# Patient Record
Sex: Female | Born: 1980 | Race: White | Hispanic: No | Marital: Single | Smoking: Current some day smoker
Health system: Southern US, Community
[De-identification: ages and names within clinical notes are randomized; demographics above are authoritative.]

## PROBLEM LIST (undated history)

## (undated) DIAGNOSIS — IMO0002 Reserved for concepts with insufficient information to code with codable children: Secondary | ICD-10-CM

## (undated) DIAGNOSIS — F32A Depression, unspecified: Secondary | ICD-10-CM

## (undated) DIAGNOSIS — J449 Chronic obstructive pulmonary disease, unspecified: Secondary | ICD-10-CM

## (undated) DIAGNOSIS — F419 Anxiety disorder, unspecified: Secondary | ICD-10-CM

## (undated) DIAGNOSIS — F329 Major depressive disorder, single episode, unspecified: Secondary | ICD-10-CM

## (undated) DIAGNOSIS — F319 Bipolar disorder, unspecified: Secondary | ICD-10-CM

---

## 2007-11-14 HISTORY — PX: ABDOMINAL HYSTERECTOMY: SHX81

## 2011-08-16 ENCOUNTER — Encounter (HOSPITAL_COMMUNITY): Payer: Self-pay | Admitting: Emergency Medicine

## 2011-08-16 ENCOUNTER — Emergency Department (HOSPITAL_COMMUNITY)
Admission: EM | Admit: 2011-08-16 | Discharge: 2011-08-16 | Disposition: A | Discharge status: Home / Self Care | Payer: Medicaid Other | Attending: Emergency Medicine | Admitting: Emergency Medicine

## 2011-08-16 DIAGNOSIS — F1910 Other psychoactive substance abuse, uncomplicated: Secondary | ICD-10-CM | POA: Insufficient documentation

## 2011-08-16 DIAGNOSIS — J4489 Other specified chronic obstructive pulmonary disease: Secondary | ICD-10-CM | POA: Insufficient documentation

## 2011-08-16 DIAGNOSIS — F319 Bipolar disorder, unspecified: Secondary | ICD-10-CM | POA: Insufficient documentation

## 2011-08-16 DIAGNOSIS — F1010 Alcohol abuse, uncomplicated: Secondary | ICD-10-CM | POA: Insufficient documentation

## 2011-08-16 DIAGNOSIS — F411 Generalized anxiety disorder: Secondary | ICD-10-CM | POA: Insufficient documentation

## 2011-08-16 DIAGNOSIS — IMO0002 Reserved for concepts with insufficient information to code with codable children: Secondary | ICD-10-CM | POA: Insufficient documentation

## 2011-08-16 DIAGNOSIS — J449 Chronic obstructive pulmonary disease, unspecified: Secondary | ICD-10-CM | POA: Insufficient documentation

## 2011-08-16 HISTORY — DX: Anxiety disorder, unspecified: F41.9

## 2011-08-16 HISTORY — DX: Depression, unspecified: F32.A

## 2011-08-16 HISTORY — DX: Chronic obstructive pulmonary disease, unspecified: J44.9

## 2011-08-16 HISTORY — DX: Bipolar disorder, unspecified: F31.9

## 2011-08-16 HISTORY — DX: Reserved for concepts with insufficient information to code with codable children: IMO0002

## 2011-08-16 HISTORY — DX: Major depressive disorder, single episode, unspecified: F32.9

## 2011-08-16 LAB — BASIC METABOLIC PANEL
CO2: 22 mEq/L (ref 19–32)
Chloride: 105 mEq/L (ref 96–112)
Creatinine, Ser: 0.67 mg/dL (ref 0.50–1.10)
Potassium: 3.4 mEq/L — ABNORMAL LOW (ref 3.5–5.1)

## 2011-08-16 LAB — ETHANOL: Alcohol, Ethyl (B): 105 mg/dL — ABNORMAL HIGH (ref 0–11)

## 2011-08-16 LAB — CBC
HCT: 44 % (ref 36.0–46.0)
MCV: 96.5 fL (ref 78.0–100.0)
Platelets: 246 10*3/uL (ref 150–400)
RBC: 4.56 MIL/uL (ref 3.87–5.11)
WBC: 7.9 10*3/uL (ref 4.0–10.5)

## 2011-08-16 LAB — RAPID URINE DRUG SCREEN, HOSP PERFORMED
Cocaine: NOT DETECTED
Opiates: NOT DETECTED
Tetrahydrocannabinol: POSITIVE — AB

## 2011-08-16 LAB — ACETAMINOPHEN LEVEL: Acetaminophen (Tylenol), Serum: <15 ug/mL (ref 10–30)

## 2011-08-16 LAB — SALICYLATE LEVEL: Salicylate Lvl: 1.3 mg/dL — ABNORMAL LOW (ref 2.8–20.0)

## 2011-08-16 NOTE — Discharge Instructions (Signed)
If you would like to get help for alcohol or drug use, you may use [facility].

## 2011-08-16 NOTE — ED Notes (Signed)
Spoke with Dr. Colon Branch regarding patient's condition. Notified her that patient stated multiple times, "I want to go to sleep and never wake up." Patient also stated that she has kids, but they will be okay. Notified Dr. Colon Branch that patient states she has had several alcoholic beverages, has taken one 10-325 norco, and has taken 4 mg of xanax. Dr. Colon Branch stated to go ahead and move patient to room 17, to initiate suicide precautions, and to place orders for cbc, bmp, salicylate level, acetaminophen level, urine drug screen, urine pregnancy.

## 2011-08-16 NOTE — ED Notes (Signed)
Pt presents after taking some medications and she states she has overdosed on them. States she took 4mg  xanax 10mg  vicoden 2 sparks (16oz 8% alcohol) Pt is AAx4. She states that she has been under a lot of stress and upset bc boyfriend thinks she abuses drugs. Does have suicidal ideations. Pt states "I just want to go to sleep and never wake up. Denies any HI.
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
## 2011-08-16 NOTE — ED Notes (Signed)
Patient given frozen tray and po fluids

## 2011-08-16 NOTE — ED Notes (Signed)
Patient moved to room 17, environment secured. Cords and items taken out of room 17 to initiate suicide precautions. Sitter sitting with patient at this time. Patient has been wanded, valuables locked up with Engineer, materials. Belonging placed in bags with patient labels and secured in cabinets.

## 2011-08-27 ENCOUNTER — Emergency Department (HOSPITAL_COMMUNITY)
Admission: EM | Admit: 2011-08-27 | Discharge: 2011-08-27 | Disposition: A | Discharge status: Home / Self Care | Payer: Medicaid Other | Attending: Emergency Medicine | Admitting: Emergency Medicine

## 2011-08-27 ENCOUNTER — Encounter (HOSPITAL_COMMUNITY): Payer: Self-pay | Admitting: *Deleted

## 2011-08-27 ENCOUNTER — Emergency Department (HOSPITAL_COMMUNITY): Payer: Medicaid Other

## 2011-08-27 DIAGNOSIS — X500XXA Overexertion from strenuous movement or load, initial encounter: Secondary | ICD-10-CM | POA: Insufficient documentation

## 2011-08-27 DIAGNOSIS — S93401A Sprain of unspecified ligament of right ankle, initial encounter: Secondary | ICD-10-CM

## 2011-08-27 DIAGNOSIS — IMO0002 Reserved for concepts with insufficient information to code with codable children: Secondary | ICD-10-CM | POA: Insufficient documentation

## 2011-08-27 DIAGNOSIS — F411 Generalized anxiety disorder: Secondary | ICD-10-CM | POA: Insufficient documentation

## 2011-08-27 DIAGNOSIS — J4489 Other specified chronic obstructive pulmonary disease: Secondary | ICD-10-CM | POA: Insufficient documentation

## 2011-08-27 DIAGNOSIS — J449 Chronic obstructive pulmonary disease, unspecified: Secondary | ICD-10-CM | POA: Insufficient documentation

## 2011-08-27 DIAGNOSIS — S93409A Sprain of unspecified ligament of unspecified ankle, initial encounter: Secondary | ICD-10-CM | POA: Insufficient documentation

## 2011-08-27 DIAGNOSIS — F319 Bipolar disorder, unspecified: Secondary | ICD-10-CM | POA: Insufficient documentation

## 2011-08-27 DIAGNOSIS — F17200 Nicotine dependence, unspecified, uncomplicated: Secondary | ICD-10-CM | POA: Insufficient documentation

## 2011-08-27 MED ORDER — OXYCODONE-ACETAMINOPHEN 5-325 MG PO TABS
1.0000 | ORAL_TABLET | Freq: Once | ORAL | Status: AC
  Administered 2011-08-27: 1 via ORAL

## 2011-08-27 MED ORDER — OXYCODONE-ACETAMINOPHEN 5-325 MG PO TABS
ORAL_TABLET | ORAL | Status: AC
  Administered 2011-08-27: 1 via ORAL
  Filled 2011-08-27: qty 1

## 2011-08-27 MED ORDER — OXYCODONE-ACETAMINOPHEN 5-325 MG PO TABS: ORAL_TABLET | ORAL | Status: DC

## 2011-08-27 NOTE — Discharge Instructions (Signed)
Take the pain med as directed.  Take ibuprofen 800 mg every 8 hrs with foodd.apply ice several times daily amd elevate as much as possible.  Wear the splint for 2-3 weeks.  Follow up with either of the orthopedists.

## 2011-08-27 NOTE — ED Notes (Signed)
Pt had right ankle roll on her while on a step last night, swelling noted to right ankle

## 2011-08-27 NOTE — ED Notes (Signed)
Pt states she was going down a step and her "ankle rolled" swelling to lateral side of right ankle.

## 2011-08-27 NOTE — ED Notes (Signed)
Pt verbalized understanding of d/c instructions and without further questions

## 2011-08-27 NOTE — ED Provider Notes (Signed)
History     CSN: [id number]  Arrival date & time 08/27/11  1317   First MD Initiated Contact with Patient 08/27/11 1401      Chief Complaint  Patient presents with  . Ankle Pain    (Consider location/radiation/quality/duration/timing/severity/associated sxs/prior treatment) HPI Comments: Walking and inverted R ankle.  Has taken hydrocodone with no relief.  No other injuries.  Patient is a 31 y.o. female presenting with ankle pain. The history is provided by the patient. No language interpreter was used.  Ankle Pain  The incident occurred 3 to 5 hours ago. The incident occurred at home. The injury mechanism was a fall. Pain location: R ankle. The pain is severe. Pertinent negatives include no numbness. She reports no foreign bodies present. The symptoms are aggravated by bearing weight and palpation.    Past Medical History  Diagnosis Date  . Anxiety   . Depression   . Bipolar 1 disorder   . DDD (degenerative disc disease)   . COPD (chronic obstructive pulmonary disease)     Pt states she thinks she has either COPD or it is lung cancer.    Past Surgical History  Procedure Date  . Abdominal hysterectomy 11-14-07    Family History  Problem Relation Age of Onset  . Hypertension Mother   . Hypertension Father     History  Substance Use Topics  . Smoking status: Current Everyday Smoker -- 2.0 packs/day  . Smokeless tobacco: Not on file  . Alcohol Use: Yes    OB History    Grav Para Term Preterm Abortions TAB SAB Ect Mult Living                  Review of Systems  Musculoskeletal:       Ankle injury  Neurological: Negative for weakness and numbness.  All other systems reviewed and are negative.    Allergies  Ultram  Home Medications   Current Outpatient Rx  Name Route Sig Dispense Refill  . CEPHALEXIN 500 MG PO CAPS Oral Take 500 mg by mouth 3 (three) times daily.    Marland Kitchen FLUOXETINE HCL 20 MG PO TABS Oral Take 20 mg by mouth 4 (four) times daily.    Marland Kitchen  HYDROCODONE-ACETAMINOPHEN 10-325 MG PO TABS Oral Take 1 tablet by mouth 4 (four) times daily.     . OXYCODONE-ACETAMINOPHEN 5-325 MG PO TABS  One tab po q 6 hrs prn pain 12 tablet 0    BP 100/80  Pulse 74  Temp 97.5 F (36.4 C) (Oral)  Resp 16  Ht 5\' 7"  (1.702 m)  Wt 150 lb (68.04 kg)  BMI 23.49 kg/m2  SpO2 100%  Physical Exam  Nursing note and vitals reviewed. Constitutional: She is oriented to person, place, and time. She appears well-developed and well-nourished. No distress.  HENT:  Head: Normocephalic and atraumatic.  Eyes: EOM are normal.  Neck: Normal range of motion.  Cardiovascular: Normal rate, regular rhythm and normal heart sounds.   Pulmonary/Chest: Effort normal and breath sounds normal.  Abdominal: Soft. She exhibits no distension. There is no tenderness.  Musculoskeletal:       Right ankle: She exhibits decreased range of motion and swelling. She exhibits no ecchymosis, no deformity, no laceration and normal pulse. tenderness. Lateral malleolus tenderness found.       Feet:  Neurological: She is alert and oriented to person, place, and time.  Skin: Skin is warm and dry.  Psychiatric: She has a normal mood and affect. Judgment  normal.    ED Course  Procedures (including critical care time)  Labs Reviewed - No data to display Dg Ankle Complete Right  08/27/2011  *RADIOLOGY REPORT*  Clinical Data: 31 year old female with right ankle pain following injury.  RIGHT ANKLE - COMPLETE 3+ VIEW  Comparison: None  Findings: No evidence of acute fracture, subluxation or dislocation identified.  No radio-opaque foreign bodies are present.  No focal bony lesions are noted.  The joint spaces are unremarkable.  Lateral soft tissue swelling is identified.  IMPRESSION: Soft tissue swelling without acute bony abnormality.  Original Report Authenticated By: Rosendo Gros, M.D.     1. Right ankle sprain       MDM  ASO, crutches, ice, elevation.  rx-percocet, 12 F/u with dr.  Hilda Lias or Romeo Apple.       Worthy Rancher, PA 08/27/11 1545  Worthy Rancher, PA 08/27/11 1556

## 2011-08-27 NOTE — ED Provider Notes (Signed)
Medical screening examination/treatment/procedure(s) were performed by non-physician practitioner and as supervising physician I was immediately available for consultation/collaboration.  Shelda Jakes, MD 08/27/11 [phone]

## 2011-08-27 NOTE — ED Notes (Signed)
R. Miller, PA at bedside  

## 2011-10-19 ENCOUNTER — Emergency Department (HOSPITAL_COMMUNITY)
Admission: EM | Admit: 2011-10-19 | Discharge: 2011-10-19 | Disposition: A | Discharge status: Home / Self Care | Payer: Medicaid Other | Attending: Emergency Medicine | Admitting: Emergency Medicine

## 2011-10-19 ENCOUNTER — Encounter (HOSPITAL_COMMUNITY): Payer: Self-pay | Admitting: *Deleted

## 2011-10-19 DIAGNOSIS — F17200 Nicotine dependence, unspecified, uncomplicated: Secondary | ICD-10-CM | POA: Insufficient documentation

## 2011-10-19 DIAGNOSIS — J4489 Other specified chronic obstructive pulmonary disease: Secondary | ICD-10-CM | POA: Insufficient documentation

## 2011-10-19 DIAGNOSIS — F32A Depression, unspecified: Secondary | ICD-10-CM

## 2011-10-19 DIAGNOSIS — F329 Major depressive disorder, single episode, unspecified: Secondary | ICD-10-CM

## 2011-10-19 DIAGNOSIS — J449 Chronic obstructive pulmonary disease, unspecified: Secondary | ICD-10-CM | POA: Insufficient documentation

## 2011-10-19 DIAGNOSIS — F319 Bipolar disorder, unspecified: Secondary | ICD-10-CM | POA: Insufficient documentation

## 2011-10-19 DIAGNOSIS — Z046 Encounter for general psychiatric examination, requested by authority: Secondary | ICD-10-CM | POA: Insufficient documentation

## 2011-10-19 DIAGNOSIS — F411 Generalized anxiety disorder: Secondary | ICD-10-CM | POA: Insufficient documentation

## 2011-10-19 LAB — URINE MICROSCOPIC-ADD ON

## 2011-10-19 LAB — CBC
HCT: 43.7 % (ref 36.0–46.0)
Hemoglobin: 15.1 g/dL — ABNORMAL HIGH (ref 12.0–15.0)
MCH: 33.3 pg (ref 26.0–34.0)
MCV: 96.5 fL (ref 78.0–100.0)
RBC: 4.53 MIL/uL (ref 3.87–5.11)
WBC: 6.1 10*3/uL (ref 4.0–10.5)

## 2011-10-19 LAB — BASIC METABOLIC PANEL
BUN: 11 mg/dL (ref 6–23)
CO2: 23 mEq/L (ref 19–32)
Calcium: 9.2 mg/dL (ref 8.4–10.5)
Chloride: 108 mEq/L (ref 96–112)
Creatinine, Ser: 0.66 mg/dL (ref 0.50–1.10)
Glucose, Bld: 106 mg/dL — ABNORMAL HIGH (ref 70–99)

## 2011-10-19 LAB — URINALYSIS, ROUTINE W REFLEX MICROSCOPIC
Bilirubin Urine: NEGATIVE
Glucose, UA: NEGATIVE mg/dL
Ketones, ur: NEGATIVE mg/dL
Specific Gravity, Urine: 1.015 (ref 1.005–1.030)
pH: 6 (ref 5.0–8.0)

## 2011-10-19 LAB — RAPID URINE DRUG SCREEN, HOSP PERFORMED
Barbiturates: NOT DETECTED
Benzodiazepines: NOT DETECTED
Cocaine: NOT DETECTED
Opiates: NOT DETECTED
Tetrahydrocannabinol: POSITIVE — AB

## 2011-10-19 NOTE — ED Notes (Addendum)
Pt to department by SD. Reports general suicidal ideation, no specific plan at this time.  Pt tearful but cooperative at present. Pt reporting that she doesn't take her Xanax or Prozac as it affects her ability to carry out daily activities. Reports no mental health services.

## 2011-10-19 NOTE — ED Provider Notes (Signed)
Pt no longer suicidal. She wants to go home and follow up as out pt.  Pt had alcohol in her last night when she was depressed.  Benny Lennert, MD 10/19/11 [phone]
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
## 2011-10-19 NOTE — ED Notes (Signed)
Pt remains sleeping. NAD. Pt has not woken up to eat breakfast. Sitter remains at bedside.
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
## 2011-10-19 NOTE — ED Notes (Signed)
Pt's family member arrived to take pt home. Pt signed out and escorted out to family.

## 2011-10-19 NOTE — ED Notes (Signed)
Pt sitting in the floor stating she wants to talk to someone soon because she is worried about her boy. Informed pt that [facility] is currently in the ED and will be in shortly. Asked pt to get into bed and out of floor. Pt refused. Offered pt a chair and pt refused.

## 2013-11-13 ENCOUNTER — Emergency Department (HOSPITAL_COMMUNITY)
Admission: EM | Admit: 2013-11-13 | Discharge: 2013-11-13 | Discharge status: Against Medical Advice | Payer: Medicaid Other | Attending: Emergency Medicine | Admitting: Emergency Medicine

## 2013-11-13 ENCOUNTER — Encounter (HOSPITAL_COMMUNITY): Payer: Self-pay | Admitting: Emergency Medicine

## 2013-11-13 DIAGNOSIS — J449 Chronic obstructive pulmonary disease, unspecified: Secondary | ICD-10-CM | POA: Insufficient documentation

## 2013-11-13 DIAGNOSIS — J4489 Other specified chronic obstructive pulmonary disease: Secondary | ICD-10-CM | POA: Insufficient documentation

## 2013-11-13 DIAGNOSIS — F17200 Nicotine dependence, unspecified, uncomplicated: Secondary | ICD-10-CM | POA: Diagnosis not present

## 2013-11-13 MED ORDER — ALBUTEROL SULFATE (2.5 MG/3ML) 0.083% IN NEBU
5.0000 mg | INHALATION_SOLUTION | Freq: Once | RESPIRATORY_TRACT | Status: AC
  Administered 2013-11-13: 5 mg via RESPIRATORY_TRACT
  Filled 2013-11-13: qty 6

## 2013-11-13 NOTE — ED Notes (Signed)
Patient states she is going to leave.  Told her to come back if needed

## 2013-11-13 NOTE — ED Notes (Signed)
Pt. seen at [facility] yesterday diagnosed with bronchitis  reports persistent dry cough with chest congestion and SOB , denies fever or chills.

## 2015-11-26 ENCOUNTER — Emergency Department (HOSPITAL_COMMUNITY): Payer: Medicaid Other

## 2015-11-26 ENCOUNTER — Encounter (HOSPITAL_COMMUNITY): Payer: Self-pay | Admitting: *Deleted

## 2015-11-26 ENCOUNTER — Emergency Department (HOSPITAL_COMMUNITY)
Admission: EM | Admit: 2015-11-26 | Discharge: 2015-11-27 | Disposition: A | Discharge status: Home / Self Care | Payer: Medicaid Other | Attending: Emergency Medicine | Admitting: Emergency Medicine

## 2015-11-26 DIAGNOSIS — F17200 Nicotine dependence, unspecified, uncomplicated: Secondary | ICD-10-CM | POA: Insufficient documentation

## 2015-11-26 DIAGNOSIS — J209 Acute bronchitis, unspecified: Secondary | ICD-10-CM | POA: Diagnosis not present

## 2015-11-26 DIAGNOSIS — Z79899 Other long term (current) drug therapy: Secondary | ICD-10-CM | POA: Insufficient documentation

## 2015-11-26 DIAGNOSIS — R05 Cough: Secondary | ICD-10-CM | POA: Diagnosis present

## 2015-11-26 DIAGNOSIS — J449 Chronic obstructive pulmonary disease, unspecified: Secondary | ICD-10-CM | POA: Insufficient documentation

## 2015-11-26 LAB — BASIC METABOLIC PANEL
ANION GAP: 8 (ref 5–15)
BUN: 12 mg/dL (ref 6–20)
CHLORIDE: 103 mmol/L (ref 101–111)
CO2: 20 mmol/L — AB (ref 22–32)
Calcium: 8.7 mg/dL — ABNORMAL LOW (ref 8.9–10.3)
Creatinine, Ser: 1 mg/dL (ref 0.44–1.00)
GFR calc Af Amer: >60 mL/min (ref >60)
GLUCOSE: 115 mg/dL — AB (ref 65–99)
POTASSIUM: 3.2 mmol/L — AB (ref 3.5–5.1)
Sodium: 131 mmol/L — ABNORMAL LOW (ref 135–145)

## 2015-11-26 LAB — CBC WITH DIFFERENTIAL/PLATELET
BASOS ABS: 0 10*3/uL (ref 0.0–0.1)
Basophils Relative: 0 %
EOS PCT: 2 %
Eosinophils Absolute: 0.3 10*3/uL (ref 0.0–0.7)
HEMATOCRIT: 47.2 % — AB (ref 36.0–46.0)
HEMOGLOBIN: 16.9 g/dL — AB (ref 12.0–15.0)
LYMPHS ABS: 4.6 10*3/uL — AB (ref 0.7–4.0)
LYMPHS PCT: 32 %
MCH: 34.4 pg — AB (ref 26.0–34.0)
MCHC: 35.8 g/dL (ref 30.0–36.0)
MCV: 96.1 fL (ref 78.0–100.0)
Monocytes Absolute: 1.1 10*3/uL — ABNORMAL HIGH (ref 0.1–1.0)
Monocytes Relative: 8 %
NEUTROS ABS: 8.1 10*3/uL — AB (ref 1.7–7.7)
NEUTROS PCT: 58 %
PLATELETS: 326 10*3/uL (ref 150–400)
RBC: 4.91 MIL/uL (ref 3.87–5.11)
RDW: 12.4 % (ref 11.5–15.5)
WBC: 14.1 10*3/uL — ABNORMAL HIGH (ref 4.0–10.5)

## 2015-11-26 LAB — TROPONIN I: Troponin I: <0.03 ng/mL (ref <0.03)

## 2015-11-26 MED ORDER — SODIUM CHLORIDE 0.9 % IV BOLUS (SEPSIS)
1000.0000 mL | Freq: Once | INTRAVENOUS | Status: AC
  Administered 2015-11-26: 1000 mL via INTRAVENOUS

## 2015-11-26 MED ORDER — IOPAMIDOL (ISOVUE-370) INJECTION 76%
100.0000 mL | Freq: Once | INTRAVENOUS | Status: AC | PRN
  Administered 2015-11-27: 100 mL via INTRAVENOUS

## 2015-11-26 MED ORDER — MORPHINE SULFATE (PF) 4 MG/ML IV SOLN
4.0000 mg | Freq: Once | INTRAVENOUS | Status: AC
Start: 2015-11-26 — End: 2015-11-26
  Administered 2015-11-26: 4 mg via INTRAVENOUS
  Filled 2015-11-26: qty 1

## 2015-11-26 MED ORDER — ONDANSETRON HCL 4 MG/2ML IJ SOLN
4.0000 mg | Freq: Once | INTRAMUSCULAR | Status: AC
  Administered 2015-11-26: 4 mg via INTRAVENOUS
  Filled 2015-11-26: qty 2

## 2015-11-26 NOTE — ED Triage Notes (Signed)
Pt states that she had a pcp appointment on 11/16/2015 for copd excerebration, sinus infection, was placed on antibiotics and steroids, did not get any better, was seen at pcp office yesterday, had chest xray performed with negative results, was given second dose of steroids but pt did not pick up prescription, presents to er today with c/o dehydration, cough that is productive with clear/ green sputum production, chest pain that is worse with coughing,

## 2015-11-27 MED ORDER — KETOROLAC TROMETHAMINE 30 MG/ML IJ SOLN
30.0000 mg | Freq: Once | INTRAMUSCULAR | Status: AC
  Administered 2015-11-27: 30 mg via INTRAVENOUS
  Filled 2015-11-27: qty 1

## 2015-11-27 MED ORDER — NAPROXEN 500 MG PO TABS: 500.0000 mg | ORAL_TABLET | Freq: Two times a day (BID) | ORAL | 0 refills | Status: DC

## 2015-11-27 MED ORDER — PROMETHAZINE-CODEINE 6.25-10 MG/5ML PO SYRP: 5.0000 mL | ORAL_SOLUTION | ORAL | 0 refills | Status: DC | PRN

## 2015-11-27 MED ORDER — BENZONATATE 100 MG PO CAPS: 200.0000 mg | ORAL_CAPSULE | Freq: Three times a day (TID) | ORAL | 0 refills | Status: DC | PRN

## 2015-11-27 MED ORDER — PROMETHAZINE-CODEINE 6.25-10 MG/5ML PO SYRP
5.0000 mL | ORAL_SOLUTION | Freq: Once | ORAL | Status: AC
  Administered 2015-11-27: 5 mL via ORAL
  Filled 2015-11-27: qty 5

## 2015-11-27 NOTE — ED Provider Notes (Signed)
[facility] Provider Note   CSN: [id number] Arrival date & time: 11/26/15  2152     History   Chief Complaint Chief Complaint  Patient presents with  . Cough    HPI Emma Conner is a 35 y.o. female with a past medical history significant for COPD presenting with a 10 day history of COPD exacerbation.  She initially had a sinus infection and was placed on antibiotics and steroids, completing a Z-Pak 5 days ago which did not improve her symptoms, in fact feels worse.  She describes cough which has been occasionally productive of blood-tinged sputum associated with left anterior and posterior chest wall pain with both coughing and deep inspiration.  She endorses shortness of breath and intermittent wheezing.  She denies any ongoing fevers or chills.  She was seen by her PCP yesterday at which point a repeat chest x-ray was performed and she reports this was negative.  She was placed on a second round of steroids yesterday but has not yet filled this prescription.  She denies abdominal pain, nausea or vomiting.  She also denies peripheral edema, calf or leg tenderness.  She has had no recent long periods of inactivty, no long road trips, she does not take estrogen therapy or other risk factors for dvt.  She is found alleviators for symptoms.  The history is provided by the patient and the spouse.    Past Medical History:  Diagnosis Date  . Anxiety   . Bipolar 1 disorder (HCC)   . COPD (chronic obstructive pulmonary disease) (HCC)    Pt states she thinks she has either COPD or it is lung cancer.  . DDD (degenerative disc disease)   . Depression     There are no active problems to display for this patient.   Past Surgical History:  Procedure Laterality Date  . ABDOMINAL HYSTERECTOMY  11-14-07    OB History    No data available       Home Medications    Prior to Admission medications   Medication Sig Start Date End Date Taking? Authorizing Provider  albuterol  (PROVENTIL HFA;VENTOLIN HFA) 108 (90 Base) MCG/ACT inhaler Inhale 1-2 puffs into the lungs every 6 (six) hours as needed for wheezing or shortness of breath.  11/16/15  Yes Historical Provider, MD  benzonatate (TESSALON) 100 MG capsule Take 2 capsules (200 mg total) by mouth 3 (three) times daily as needed. 11/27/15   Burgess Amor, PA-C  naproxen (NAPROSYN) 500 MG tablet Take 1 tablet (500 mg total) by mouth 2 (two) times daily. 11/27/15   Burgess Amor, PA-C  ondansetron (ZOFRAN-ODT) 8 MG disintegrating tablet Take 8 mg by mouth every 8 (eight) hours as needed for nausea or vomiting.  11/25/15   Historical Provider, MD  predniSONE (DELTASONE) 20 MG tablet Take 3 tabs on first day, then 2 tabs daily for 2 days, then 1 tab daily for 2 days, then 1/2 tab daily for 2 days starting on 11/25/2015 11/25/15 12/02/15  Historical Provider, MD    Family History Family History  Problem Relation Age of Onset  . Hypertension Mother   . Hypertension Father     Social History Social History  Substance Use Topics  . Smoking status: Current Some Day Smoker    Packs/day: 0.50  . Smokeless tobacco: Never Used  . Alcohol use Yes     Allergies   Benadryl [diphenhydramine]; Penicillins; and Ultram [tramadol]   Review of Systems Review of Systems  Constitutional: Negative for fever.  HENT: Positive for congestion. Negative for sore throat.   Eyes: Negative.   Respiratory: Positive for shortness of breath and wheezing. Negative for chest tightness.   Cardiovascular: Positive for chest pain. Negative for palpitations and leg swelling.  Gastrointestinal: Negative for abdominal pain, nausea and vomiting.  Genitourinary: Negative.   Musculoskeletal: Negative for arthralgias, joint swelling and neck pain.  Skin: Negative.  Negative for rash and wound.  Neurological: Positive for weakness. Negative for dizziness, light-headedness, numbness and headaches.  Psychiatric/Behavioral: Negative.      Physical  Exam Updated Vital Signs BP 103/75   Pulse 90   Temp 97.8 F (36.6 C) (Oral)   Resp 20   Ht 5\' 7"  (1.702 m)   Wt 88.9 kg   SpO2 99%   BMI 30.70 kg/m   Physical Exam  Constitutional: She appears well-developed and well-nourished.  HENT:  Head: Normocephalic and atraumatic.  Eyes: Conjunctivae are normal.  Neck: Normal range of motion.  Cardiovascular: Normal rate, regular rhythm, normal heart sounds and intact distal pulses.   Initially tachycardic.   Pulmonary/Chest: Effort normal. No accessory muscle usage. No respiratory distress. She has wheezes in the left lower field.  Sparse left lower field expiratory wheeze, otherwise patient has good aeration throughout all lung fields, no areas of consolidation, no crackles or rales.  She is tender to palpation along mid left chest wall anterior, mid axillary line and mid left upper back.  Abdominal: Soft. Bowel sounds are normal. There is no tenderness.  Musculoskeletal: Normal range of motion. She exhibits no edema.  Neurological: She is alert.  Skin: Skin is warm and dry.  Psychiatric: She has a normal mood and affect.  Nursing note and vitals reviewed.    ED Treatments / Results  Labs (all labs ordered are listed, but only abnormal results are displayed) Labs Reviewed  CBC WITH DIFFERENTIAL/PLATELET - Abnormal; Notable for the following:       Result Value   WBC 14.1 (*)    Hemoglobin 16.9 (*)    HCT 47.2 (*)    MCH 34.4 (*)    Neutro Abs 8.1 (*)    Lymphs Abs 4.6 (*)    Monocytes Absolute 1.1 (*)    All other components within normal limits  BASIC METABOLIC PANEL - Abnormal; Notable for the following:    Sodium 131 (*)    Potassium 3.2 (*)    CO2 20 (*)    Glucose, Bld 115 (*)    Calcium 8.7 (*)    All other components within normal limits  TROPONIN I    EKG  EKG Interpretation  Date/Time:  Thursday November 26 2015 22:11:59 EDT Ventricular Rate:  107 PR Interval:  136 QRS Duration: 72 QT  Interval:  314 QTC Calculation: 419 R Axis:   90 Text Interpretation:  Sinus tachycardia Rightward axis No old tracing to compare Confirmed by KNAPP  MD-I, IVA ([id number]) on 11/27/2015 1:03:52 AM       Radiology  Results for orders placed or performed during the hospital encounter of 11/26/15  CBC with Differential  Result Value Ref Range   WBC 14.1 (H) 4.0 - 10.5 K/uL   RBC 4.91 3.87 - 5.11 MIL/uL   Hemoglobin 16.9 (H) 12.0 - 15.0 g/dL   HCT 95.647.2 (H) 21.336.0 - 08.646.0 %   MCV 96.1 78.0 - 100.0 fL   MCH 34.4 (H) 26.0 - 34.0 pg   MCHC 35.8 30.0 - 36.0 g/dL   RDW 57.812.4 46.911.5 - 62.915.5 %  Platelets 326 150 - 400 K/uL   Neutrophils Relative % 58 %   Neutro Abs 8.1 (H) 1.7 - 7.7 K/uL   Lymphocytes Relative 32 %   Lymphs Abs 4.6 (H) 0.7 - 4.0 K/uL   Monocytes Relative 8 %   Monocytes Absolute 1.1 (H) 0.1 - 1.0 K/uL   Eosinophils Relative 2 %   Eosinophils Absolute 0.3 0.0 - 0.7 K/uL   Basophils Relative 0 %   Basophils Absolute 0.0 0.0 - 0.1 K/uL  Basic metabolic panel  Result Value Ref Range   Sodium 131 (L) 135 - 145 mmol/L   Potassium 3.2 (L) 3.5 - 5.1 mmol/L   Chloride 103 101 - 111 mmol/L   CO2 20 (L) 22 - 32 mmol/L   Glucose, Bld 115 (H) 65 - 99 mg/dL   BUN 12 6 - 20 mg/dL   Creatinine, Ser 1.61 0.44 - 1.00 mg/dL   Calcium 8.7 (L) 8.9 - 10.3 mg/dL   GFR calc non Af Amer >60 >60 mL/min   GFR calc Af Amer >60 >60 mL/min   Anion gap 8 5 - 15  Troponin I  Result Value Ref Range   Troponin I <0.03 <0.03 ng/mL   Dg Chest 2 View  Result Date: 11/26/2015 CLINICAL DATA:  35 y/o F; for worsening productive cough, weakness, and shortness of breath. History of COPD. EXAM: CHEST  2 VIEW COMPARISON:  None. FINDINGS: Normal cardiomediastinal silhouette. Hyperinflated lungs consistent with history of COPD. No focal consolidation, pneumothorax, or pleural effusion. No acute osseous abnormality. Mild multilevel degenerative changes of the spine. IMPRESSION: No active cardiopulmonary disease.   COPD. Electronically Signed   By: Mitzi Hansen M.D.   On: 11/26/2015 22:52   Ct Angio Chest Pe W Or Wo Contrast  Result Date: 11/27/2015 CLINICAL DATA:  Acute onset of pleuritic chest pain. Initial encounter. EXAM: CT ANGIOGRAPHY CHEST WITH CONTRAST TECHNIQUE: Multidetector CT imaging of the chest was performed using the standard protocol during bolus administration of intravenous contrast. Multiplanar CT image reconstructions and MIPs were obtained to evaluate the vascular anatomy. CONTRAST:  10 mL of Isovue 370 IV contrast COMPARISON:  Chest radiograph performed earlier today at 10:12 p.m. FINDINGS: Cardiovascular: There is no evidence of pulmonary embolus. The heart is unremarkable in appearance. The thoracic aorta appears intact. The great vessels are unremarkable. Mediastinum/Nodes: The mediastinum is unremarkable in appearance. No mediastinal lymphadenopathy is seen. No pericardial effusions identified. The thyroid gland is unremarkable. No axillary lymphadenopathy is appreciated. Lungs/Pleura: The lungs appear essentially clear bilaterally. No focal consolidation, pleural effusion or pneumothorax is seen. No masses are identified. Upper Abdomen: The visualized portions of the liver and spleen are unremarkable. The visualized portions of the gallbladder, pancreas, adrenal glands and kidneys are within normal limits. Musculoskeletal: No acute osseous abnormalities are identified. The visualized musculature is unremarkable in appearance. Review of the MIP images confirms the above findings. IMPRESSION: 1. No evidence of pulmonary embolus. 2. Lungs clear bilaterally. Electronically Signed   By: Roanna Raider M.D.   On: 11/27/2015 06:44     Procedures Procedures (including critical care time)  Medications Ordered in ED Medications  sodium chloride 0.9 % bolus 1,000 mL (0 mLs Intravenous Stopped 11/27/15 0046)  morphine 4 MG/ML injection 4 mg (4 mg Intravenous Given 11/26/15 2355)   ondansetron (ZOFRAN) injection 4 mg (4 mg Intravenous Given 11/26/15 2355)  iopamidol (ISOVUE-370) 76 % injection 100 mL (100 mLs Intravenous Contrast Given 11/27/15 0021)     Initial Impression /  Assessment and Plan / ED Course  I have reviewed the triage vital signs and the nursing notes.  Pertinent labs & imaging results that were available during my care of the patient were reviewed by me and considered in my medical decision making (see chart for details).  Clinical Course    Pt with sx suggesting acute bronchitis, but pleuritic quality and hemoptysis, CT angio negative for PE or pneumonia.  Pt placed on tessalon, naproxen.  Encouraged to get her prednisone filled she was prescribed by her pcp.  Plan f/u with pcp if sx persist or worsen.  Final Clinical Impressions(s) / ED Diagnoses   Final diagnoses:  Acute bronchitis, unspecified organism    New Prescriptions New Prescriptions   BENZONATATE (TESSALON) 100 MG CAPSULE    Take 2 capsules (200 mg total) by mouth 3 (three) times daily as needed.   NAPROXEN (NAPROSYN) 500 MG TABLET    Take 1 tablet (500 mg total) by mouth 2 (two) times daily.     Burgess Amor, PA-C 11/27/15 0123    Burgess Amor, PA-C 11/27/15 1610    Devoria Albe, MD 11/30/15 2259

## 2015-11-27 NOTE — Discharge Instructions (Signed)
Continue to take the prednisone you were prescribed by your doctor.  Start the medicines prescribed for inflammatory pain and to help with cough suppression. Rest and make sure you are drinking plenty of fluids.

## 2015-11-27 NOTE — ED Notes (Signed)
Pt requesting ice chips, warm blanket, explained to pt that she could not eat or drink due to pending test results, warm blanket given per request, update given,

## 2015-11-27 NOTE — ED Notes (Signed)
Pt requesting pain medication, Raynelle FanningJulie PA notified,

## 2016-06-28 ENCOUNTER — Emergency Department (HOSPITAL_COMMUNITY)
Admission: EM | Admit: 2016-06-28 | Discharge: 2016-07-01 | Disposition: A | Discharge status: Home / Self Care | Payer: Medicaid Other | Attending: Emergency Medicine | Admitting: Emergency Medicine

## 2016-06-28 ENCOUNTER — Encounter (HOSPITAL_COMMUNITY): Payer: Self-pay | Admitting: Emergency Medicine

## 2016-06-28 DIAGNOSIS — F419 Anxiety disorder, unspecified: Secondary | ICD-10-CM | POA: Diagnosis not present

## 2016-06-28 DIAGNOSIS — F329 Major depressive disorder, single episode, unspecified: Secondary | ICD-10-CM

## 2016-06-28 DIAGNOSIS — F17210 Nicotine dependence, cigarettes, uncomplicated: Secondary | ICD-10-CM | POA: Insufficient documentation

## 2016-06-28 DIAGNOSIS — F319 Bipolar disorder, unspecified: Secondary | ICD-10-CM | POA: Diagnosis present

## 2016-06-28 DIAGNOSIS — J449 Chronic obstructive pulmonary disease, unspecified: Secondary | ICD-10-CM | POA: Insufficient documentation

## 2016-06-28 DIAGNOSIS — F1290 Cannabis use, unspecified, uncomplicated: Secondary | ICD-10-CM | POA: Diagnosis not present

## 2016-06-28 DIAGNOSIS — F3161 Bipolar disorder, current episode mixed, mild: Secondary | ICD-10-CM | POA: Diagnosis not present

## 2016-06-28 DIAGNOSIS — Z79899 Other long term (current) drug therapy: Secondary | ICD-10-CM | POA: Diagnosis not present

## 2016-06-28 DIAGNOSIS — F32A Depression, unspecified: Secondary | ICD-10-CM

## 2016-06-28 LAB — I-STAT BETA HCG BLOOD, ED (MC, WL, AP ONLY): I-stat hCG, quantitative: <5 m[IU]/mL (ref <5)

## 2016-06-28 LAB — CBC WITH DIFFERENTIAL/PLATELET
Basophils Absolute: 0 10*3/uL (ref 0.0–0.1)
Basophils Relative: 0 %
EOS PCT: 6 %
Eosinophils Absolute: 0.7 10*3/uL (ref 0.0–0.7)
HEMATOCRIT: 42.7 % (ref 36.0–46.0)
Hemoglobin: 14.7 g/dL (ref 12.0–15.0)
LYMPHS ABS: 2.3 10*3/uL (ref 0.7–4.0)
LYMPHS PCT: 20 %
MCH: 34.1 pg — AB (ref 26.0–34.0)
MCHC: 34.4 g/dL (ref 30.0–36.0)
MCV: 99.1 fL (ref 78.0–100.0)
MONO ABS: 0.8 10*3/uL (ref 0.1–1.0)
Monocytes Relative: 7 %
NEUTROS ABS: 7.7 10*3/uL (ref 1.7–7.7)
Neutrophils Relative %: 67 %
PLATELETS: 299 10*3/uL (ref 150–400)
RBC: 4.31 MIL/uL (ref 3.87–5.11)
RDW: 13.5 % (ref 11.5–15.5)
WBC: 11.6 10*3/uL — ABNORMAL HIGH (ref 4.0–10.5)

## 2016-06-28 LAB — ACETAMINOPHEN LEVEL: Acetaminophen (Tylenol), Serum: 11 ug/mL (ref 10–30)

## 2016-06-28 LAB — RAPID URINE DRUG SCREEN, HOSP PERFORMED
Amphetamines: NOT DETECTED
Barbiturates: NOT DETECTED
Benzodiazepines: NOT DETECTED
COCAINE: NOT DETECTED
OPIATES: NOT DETECTED
TETRAHYDROCANNABINOL: POSITIVE — AB

## 2016-06-28 LAB — COMPREHENSIVE METABOLIC PANEL
ALT: 19 U/L (ref 14–54)
AST: 21 U/L (ref 15–41)
Albumin: 3.8 g/dL (ref 3.5–5.0)
Alkaline Phosphatase: 104 U/L (ref 38–126)
Anion gap: 10 (ref 5–15)
BILIRUBIN TOTAL: 0.2 mg/dL — AB (ref 0.3–1.2)
BUN: 8 mg/dL (ref 6–20)
CHLORIDE: 105 mmol/L (ref 101–111)
CO2: 20 mmol/L — ABNORMAL LOW (ref 22–32)
Calcium: 8.7 mg/dL — ABNORMAL LOW (ref 8.9–10.3)
Creatinine, Ser: 0.8 mg/dL (ref 0.44–1.00)
Glucose, Bld: 92 mg/dL (ref 65–99)
Potassium: 4 mmol/L (ref 3.5–5.1)
Sodium: 135 mmol/L (ref 135–145)
TOTAL PROTEIN: 7 g/dL (ref 6.5–8.1)

## 2016-06-28 LAB — ETHANOL

## 2016-06-28 LAB — SALICYLATE LEVEL

## 2016-06-28 MED ORDER — ONDANSETRON 8 MG PO TBDP: 8.0000 mg | ORAL_TABLET | Freq: Three times a day (TID) | ORAL | Status: DC | PRN

## 2016-06-28 MED ORDER — ALBUTEROL SULFATE HFA 108 (90 BASE) MCG/ACT IN AERS: 1.0000 | INHALATION_SPRAY | Freq: Four times a day (QID) | RESPIRATORY_TRACT | Status: DC | PRN

## 2016-06-28 MED ORDER — NAPROXEN 500 MG PO TABS: 500.0000 mg | ORAL_TABLET | Freq: Two times a day (BID) | ORAL | Status: DC

## 2016-06-28 NOTE — ED Notes (Signed)
[employer] officer released from observation duty.

## 2016-06-28 NOTE — ED Notes (Signed)
BH counselor called and reported pt recommendation was for inpatient therapy. IVC papers being faxed to [facility] now.

## 2016-06-28 NOTE — ED Notes (Signed)
Pt informed that she could have something to eat if desired- pt states she does not want anything at this time. Pt was given time to have 5 minute phone call to check on her kids. This nurse was present in room, pt tearful, but did not get loud or aggressive. Pt given copy of rules and guidelines for [facility] patients and visiting times.
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
## 2016-06-28 NOTE — ED Provider Notes (Signed)
[facility]-EM[facility] DEPT Provider Note   CSN: [id number] Arrival date & time: 06/28/16  1944   By signing my name below, I, Bobbie Stack, attest that this documentation has been prepared under the direction and in the presence of Bethann Berkshire, MD. Electronically Signed: Bobbie Stack, Scribe. 06/28/16. 8:16 PM. History   Chief Complaint Chief Complaint  Patient presents with  . V70.1   HPI Comments: Emma Conner is a 36 y.o. female with a hx of anxiety, depression, and bipolar 1 disorder who presents to the Emergency Department by [employer] after they were called to the patient's residence for a domestic dispute. The [employer] was called out to the patient's house for a domestic call. The patient told the officers that she wanted to see her mom which died a year ago. The patient also states that she had recently taken 30 ibuprofen a few hours ago. She states that she wanted to go to sleep. She typically takes OTC sleeping pills when she is trying to go to sleep. The last time she was asleep was around 3 to 4 hours ago. The patient also states that she had cut herself on her right arm by accident in 2003. The officer was concerned for the patient's safety and brought her to the ED to be evaluated. She is unsure of when her last tetanus shot was. She denies suicidal or homicidal ideas The history is provided by the patient. No language interpreter was used.     Past Medical History:  Diagnosis Date  . Anxiety   . Bipolar 1 disorder (HCC)   . COPD (chronic obstructive pulmonary disease) (HCC)    Pt states she thinks she has either COPD or it is lung cancer.  . DDD (degenerative disc disease)   . Depression     There are no active problems to display for this patient.   Past Surgical History:  Procedure Laterality Date  . ABDOMINAL HYSTERECTOMY  11-14-07    OB History    No data available       Home Medications    Prior to Admission medications   Medication Sig Start Date End  Date Taking? Authorizing Provider  albuterol (PROVENTIL HFA;VENTOLIN HFA) 108 (90 Base) MCG/ACT inhaler Inhale 1-2 puffs into the lungs every 6 (six) hours as needed for wheezing or shortness of breath.  11/16/15   Historical Provider, MD  benzonatate (TESSALON) 100 MG capsule Take 2 capsules (200 mg total) by mouth 3 (three) times daily as needed. 11/27/15   Burgess Amor, PA-C  naproxen (NAPROSYN) 500 MG tablet Take 1 tablet (500 mg total) by mouth 2 (two) times daily. 11/27/15   Burgess Amor, PA-C  ondansetron (ZOFRAN-ODT) 8 MG disintegrating tablet Take 8 mg by mouth every 8 (eight) hours as needed for nausea or vomiting.  11/25/15   Historical Provider, MD  promethazine-codeine (PHENERGAN WITH CODEINE) 6.25-10 MG/5ML syrup Take 5 mLs by mouth every 4 (four) hours as needed for cough. 11/27/15   Burgess Amor, PA-C    Family History Family History  Problem Relation Age of Onset  . Hypertension Mother   . Hypertension Father     Social History Social History  Substance Use Topics  . Smoking status: Current Some Day Smoker    Packs/day: 0.50  . Smokeless tobacco: Never Used  . Alcohol use Yes     Allergies   Benadryl [diphenhydramine]; Penicillins; and Ultram [tramadol]   Review of Systems Review of Systems  Constitutional: Negative for appetite change and fatigue.  HENT: Negative for congestion, ear discharge and sinus pressure.   Eyes: Negative for discharge.  Respiratory: Negative for cough.   Cardiovascular: Negative for chest pain.  Gastrointestinal: Negative for abdominal pain and diarrhea.  Genitourinary: Negative for frequency and hematuria.  Musculoskeletal: Negative for back pain.  Skin: Negative for rash.  Neurological: Negative for seizures and headaches.  Psychiatric/Behavioral: Negative for hallucinations and suicidal ideas.     Physical Exam Updated Vital Signs BP 130/89 (BP Location: Right Arm)   Pulse (!) 122 Comment: pt crying and yelling  Temp 99.3 F (37.4  C) (Oral)   Resp 18   Ht  (1.702 m)   Wt 190 lb (86.2 kg)   SpO2 99%   BMI 29.76 kg/m   Physical Exam  Constitutional: She is oriented to person, place, and time. She appears well-developed.  HENT:  Head: Normocephalic.  Eyes: Conjunctivae and EOM are normal. No scleral icterus.  Neck: Neck supple. No thyromegaly present.  Cardiovascular: Normal rate and regular rhythm.  Exam reveals no gallop and no friction rub.   No murmur heard. Pulmonary/Chest: No stridor. She has no wheezes. She has no rales. She exhibits no tenderness.  Abdominal: She exhibits no distension. There is no tenderness. There is no rebound.  Musculoskeletal: Normal range of motion. She exhibits no edema.  Lymphadenopathy:    She has no cervical adenopathy.  Neurological: She is oriented to person, place, and time. She exhibits normal muscle tone. Coordination normal.  Skin: No rash noted. No erythema.  Healed superficial lacerations to her right arm.  Psychiatric: She has a normal mood and affect. Her behavior is normal.  Depressed. Anxious.   ED Treatments / Results  DIAGNOSTIC STUDIES: Oxygen Saturation is 99% on RA, normal by my interpretation.    COORDINATION OF CARE: 8:04 PM Discussed treatment plan with pt at bedside and pt agreed to plan. I will consult psychiatry for the patient.  Labs (all labs ordered are listed, but only abnormal results are displayed) Labs Reviewed  CBC WITH DIFFERENTIAL/PLATELET  COMPREHENSIVE METABOLIC PANEL  RAPID URINE DRUG SCREEN, HOSP PERFORMED  ACETAMINOPHEN LEVEL  SALICYLATE LEVEL  ETHANOL  I-STAT BETA HCG BLOOD, ED ([facility], [facility], [facility] ONLY)    EKG  EKG Interpretation None       Radiology No results found.  Procedures Procedures (including critical care time)  Medications Ordered in ED Medications - No data to display   Initial Impression / Assessment and Plan / ED Course  I have reviewed the triage vital signs and the nursing notes.  Pertinent  labs & imaging results that were available during my care of the patient were reviewed by me and considered in my medical decision making (see chart for details).     Labs unremarkable. Patient will be evaluated by TTS to determine her disposition from a psych standpoint Behavior health recommended inpatient treatment. Patient will be committed for depression and suicidal thoughts Final Clinical Impressions(s) / ED Diagnoses   Final diagnoses:  None    New Prescriptions New Prescriptions   No medications on file   The chart was scribed for me under my direct supervision.  I personally performed the history, physical, and medical decision making and all procedures in the evaluation of this patient.Bethann Berkshire, MD 06/28/16 2202

## 2016-06-28 NOTE — ED Triage Notes (Addendum)
Pt states she took 30 over the counter ibuprofen to go to sleep 3-4 hours ago. Pt denies any si/hi ideations. [employer] were called out for a domestic call. She told the officers that she wanted to see her mom which died a year ago. Pt states she was talking about going to the grave.

## 2016-06-29 ENCOUNTER — Emergency Department (HOSPITAL_COMMUNITY): Payer: Medicaid Other

## 2016-06-29 MED ORDER — ACETAMINOPHEN 500 MG PO TABS
500.0000 mg | ORAL_TABLET | Freq: Once | ORAL | Status: AC
  Administered 2016-06-30: 500 mg via ORAL
  Filled 2016-06-29: qty 1

## 2016-06-29 MED ORDER — LORAZEPAM 1 MG PO TABS
1.0000 mg | ORAL_TABLET | Freq: Once | ORAL | Status: AC
  Administered 2016-06-29: 1 mg via ORAL
  Filled 2016-06-29: qty 1

## 2016-06-29 MED ORDER — LORAZEPAM 1 MG PO TABS
1.0000 mg | ORAL_TABLET | Freq: Once | ORAL | Status: AC
  Administered 2016-06-29: 1 mg via ORAL
  Filled 2016-06-29 (×2): qty 1

## 2016-06-29 MED ORDER — ACETAMINOPHEN 500 MG PO TABS
ORAL_TABLET | ORAL | Status: AC
  Filled 2016-06-29: qty 1

## 2016-06-29 MED ORDER — LORAZEPAM 1 MG PO TABS
2.0000 mg | ORAL_TABLET | Freq: Once | ORAL | Status: AC
  Administered 2016-06-29: 2 mg via ORAL
  Filled 2016-06-29: qty 2

## 2016-06-29 NOTE — ED Notes (Signed)
Pt resting with eyes closed, appears to be in no distress. Respirations are even and unlabored. Pt has two point restraints applied by [employer]. [employer] Therapist, sports at bedside.

## 2016-06-29 NOTE — ED Notes (Signed)
[employer] have brought patient back, she's in her room handcuffed to the bed.

## 2016-06-29 NOTE — ED Notes (Signed)
Pt is lying on floor and has blankets pulled down and fitted sheet covering her on the floor.

## 2016-06-29 NOTE — ED Notes (Signed)
Pt given meal tray at this time, states " I don't want that". Made aware that meal tray would be available for pt if she changes her mind.

## 2016-06-29 NOTE — ED Notes (Signed)
Pt removing earrings at this time. Placed in tissue and in bag. Locked in locker.

## 2016-06-29 NOTE — ED Notes (Signed)
Pt resting with eyes closed, appears to be in no distress. Respirations are even and unlabored. Shackles are applied, no skin breakdown noted. [employer] at bedside with sitter.

## 2016-06-29 NOTE — ED Notes (Signed)
Pt has been switched to soft restraints.

## 2016-06-29 NOTE — ED Notes (Signed)
Pt offered dinner tray, stated she did not want the food. "I only want to speak to my kids". Pt reminded that she had lost phone privileges today.

## 2016-06-29 NOTE — ED Notes (Addendum)
Patient took socks off and said she was leaving. Nurse Notified. She jumped out of the bed and walked out of the ed thru the bay doors. Security was called and security is following her in the car until the police get to the location. Patient is at the [facility] with security.
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
## 2016-06-29 NOTE — ED Notes (Addendum)
Patient went to the bathroom and locked the door, I told her the door couldn't be locked she said "I am 36 yrs old and can do what I want and I don't need help going to the bathroom". She came back to her room and slammed the door. Nurse  Aware and Security was called.

## 2016-06-29 NOTE — ED Notes (Signed)
Officer has changed restraint arms at this time.

## 2016-06-29 NOTE — ED Notes (Signed)
Pt up to restroom at this time, gait even and stable. [employer] at side.

## 2016-06-29 NOTE — ED Notes (Signed)
Pt resting with eyes closed, appears to be in no distress. Respirations are even and unlabored. Pt's head is at the foot of the bed with one restraint noted to R wrist. Sitter and [employer] at bedside.

## 2016-06-29 NOTE — ED Notes (Signed)
Pt was given ativan and offered water with medication.  Pt refused water and after RN left room administering medication, pt spit in the floor.

## 2016-06-29 NOTE — ED Notes (Signed)
Report given to [facility] at this time.

## 2016-06-29 NOTE — ED Notes (Signed)
Pt made aware she would be moving rooms and offered pt to walk to restroom, pt states "since you won't let me use the phone I won't get up".

## 2016-06-29 NOTE — ED Notes (Signed)
Pt requesting something for a HA, MD Zammit notified and order given.
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
## 2016-06-29 NOTE — ED Notes (Addendum)
Wrist and ankle restraints applied, ROM intact with good cap refill and pulses.  Cuffs removed by officer.  Redness noted where cuffs were in place.

## 2016-06-29 NOTE — ED Notes (Signed)
Patient states that she is hungry. States that she didn't have lunch or dinner. Gave patient meal tray at this time. Patient did not eat.

## 2016-06-29 NOTE — ED Notes (Signed)
Pt now lying back in bed, respirations even and unlabored, denies needs. [employer] Therapist, sports at bedside.

## 2016-06-29 NOTE — ED Notes (Signed)
Pt is sticking her hand in her mouth to make herself gag.  Pt spitting in trash can.

## 2016-06-29 NOTE — ED Notes (Signed)
Pt requesting phone at this time, pt made aware that she had lost her phone privileges today due to behavior. Pt states "I won't take any more medicine today".

## 2016-06-29 NOTE — ED Notes (Signed)
Pt requested nicotine and this RN explained to the pt that we have nicotine patches.  Pt refused stated that she was allergic to the and she was just going to lay up here and die.

## 2016-06-29 NOTE — ED Notes (Signed)
Pt has broken out of soft leg restraints.

## 2016-06-29 NOTE — Progress Notes (Signed)
Pt declined by [facility] due to acuity.   Princess Bruins, MSW, LCSWA CSW Disposition [phone]
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
## 2016-06-29 NOTE — ED Notes (Signed)
Patient stated "I am going to suffocaite myself" she put the pillow over her face. Took the pillow from here. Nurse notified.

## 2016-06-29 NOTE — Progress Notes (Signed)
Per Cedric at [facility], the pt is being reviewed for possible placement.   Princess Bruins, MSW, LCSWA CSW Disposition [phone]

## 2016-06-29 NOTE — [facility] Counselor (Signed)
Pt sleeping right now. TTS will try reassessment later today.  Evette Cristal, [place] Therapeutic Triage Specialist

## 2016-06-29 NOTE — ED Notes (Addendum)
Pt called out to this RN stating she needed something for her HA and anxiety. States her R wrist is bigger than the other. MD Zammit notified of this. Mild bruising noted to wrist and forearm where pt had been banging arm that had restraints on them. Restraints have been switched and moved around multiple times.

## 2016-06-29 NOTE — ED Notes (Signed)
Pt resting with eyes closed, appears to be in no distress. Respirations are even and unlabored. One shackle applied, Archivist at bedside along with sitter.

## 2016-06-29 NOTE — ED Notes (Signed)
Pt is stating that she does not know how long she can stay here due to her not having insurance.  Pt stating that she does not need to be here and is threatening to leave and is also saying that she wants her mom.  Pt locked herself in the bathroom and came back out.  Pt will not keep the door open in her room and has now ran out the ambulance doors.  Security was called 10 minutes prior and was not able to catch patient.  Security guard Marlowe Aschoff is now following patient in a vehicle.  [employer] was called.

## 2016-06-29 NOTE — ED Notes (Signed)
Patient on phone with husband cursing at him and tearful.

## 2016-06-30 MED ORDER — LORAZEPAM 1 MG PO TABS
1.0000 mg | ORAL_TABLET | ORAL | Status: DC | PRN
  Administered 2016-06-30 (×2): 1 mg via ORAL
  Filled 2016-06-30: qty 1

## 2016-06-30 MED ORDER — ACETAMINOPHEN 500 MG PO TABS
ORAL_TABLET | ORAL | Status: AC
  Administered 2016-06-30: 500 mg
  Filled 2016-06-30: qty 1

## 2016-06-30 MED ORDER — FLUOXETINE HCL 20 MG PO CAPS
40.0000 mg | ORAL_CAPSULE | Freq: Two times a day (BID) | ORAL | Status: DC
  Administered 2016-06-30 – 2016-07-01 (×2): 40 mg via ORAL
  Filled 2016-06-30: qty 2

## 2016-06-30 MED ORDER — TRAZODONE HCL 50 MG PO TABS
50.0000 mg | ORAL_TABLET | Freq: Every evening | ORAL | Status: DC | PRN
  Administered 2016-06-30: 50 mg via ORAL
  Filled 2016-06-30: qty 1

## 2016-06-30 MED ORDER — FLUOXETINE HCL 20 MG PO CAPS
ORAL_CAPSULE | ORAL | Status: AC
  Filled 2016-06-30: qty 2

## 2016-06-30 MED ORDER — LORAZEPAM 1 MG PO TABS
ORAL_TABLET | ORAL | Status: AC
  Filled 2016-06-30: qty 1

## 2016-06-30 NOTE — ED Notes (Addendum)
Patient stated " I'm fixing to go off and leave" Nurse made aware and Jordon with security spoke with her.

## 2016-06-30 NOTE — ED Notes (Signed)
Called to room. Pt requesting tylenol, ativan and prozac. Spoke with EDP and he reordered meds

## 2016-06-30 NOTE — ED Notes (Signed)
Pt requesting to see the EDP. EDP notified

## 2016-06-30 NOTE — ED Notes (Signed)
Pt A&O x 3, no distress noted, calm & cooperative.  Pt tearful.  Family at bedside.  Monitoring for safety, Q 15 min checks in effect.

## 2016-06-30 NOTE — ED Notes (Signed)
Pt left with Emergency planning/management officer. Belongings sent with offiver

## 2016-06-30 NOTE — ED Notes (Signed)
Bed: [facility] Expected date:  Expected time:  Means of arrival:  Comments: Jeani Hawking

## 2016-06-30 NOTE — ED Notes (Signed)
Pt talking on the phone with boyfriend

## 2016-06-30 NOTE — ED Notes (Addendum)
Reported by sitter that pt spit meds down sink .  Asked pt about this and she said yes she did. EDP notified

## 2016-06-30 NOTE — ED Notes (Signed)
Patient put her meds down the drain in the sink. Nurse aware.
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
## 2016-06-30 NOTE — ED Notes (Signed)
Inetta Fermo called from behavioral health. Pt to be transferred to [facility]

## 2016-06-30 NOTE — ED Notes (Addendum)
CPS in to talk with pt Her name is Emma Conner
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
## 2016-06-30 NOTE — ED Provider Notes (Signed)
Blood pressure 106/65, pulse (!) 108, temperature 98.5 F (36.9 C), temperature source Oral, resp. rate 18, height  (1.702 m), weight 190 lb (86.2 kg), SpO2 95 %.   In short, Emma Conner is a 36 y.o. female with a chief complaint of V70.1 .  Refer to the original H&P for additional details.  The current plan of care is to following and facilitate psychiatry evaluation and disposition.  02:30 PM Behavioral health has evaluated the patient and requests transfer to [facility]. Spoke with Shaune Pollack, MD who will accept the patient in ED to ED transfer for further psychiatry evaluation and treatment. Appreciate assistance with this case.  Alona Bene, MD   Maia Plan, MD 06/30/16 [phone]
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
## 2016-07-01 DIAGNOSIS — F1290 Cannabis use, unspecified, uncomplicated: Secondary | ICD-10-CM

## 2016-07-01 DIAGNOSIS — F3161 Bipolar disorder, current episode mixed, mild: Secondary | ICD-10-CM | POA: Diagnosis not present

## 2016-07-01 DIAGNOSIS — F17210 Nicotine dependence, cigarettes, uncomplicated: Secondary | ICD-10-CM

## 2016-07-01 MED ORDER — FLUOXETINE HCL 40 MG PO CAPS: 40.0000 mg | ORAL_CAPSULE | Freq: Every day | ORAL | 0 refills | Status: AC

## 2016-07-01 MED ORDER — CARBAMAZEPINE ER 200 MG PO TB12: 200.0000 mg | ORAL_TABLET | Freq: Two times a day (BID) | ORAL | 0 refills | Status: AC

## 2016-07-01 MED ORDER — TRAZODONE HCL 50 MG PO TABS: 50.0000 mg | ORAL_TABLET | Freq: Every evening | ORAL | 0 refills | Status: AC | PRN

## 2016-07-01 MED ORDER — CARBAMAZEPINE ER 200 MG PO TB12
200.0000 mg | ORAL_TABLET | Freq: Two times a day (BID) | ORAL | Status: DC
  Administered 2016-07-01: 200 mg via ORAL
  Filled 2016-07-01: qty 1

## 2016-07-01 MED ORDER — FLUOXETINE HCL 20 MG PO CAPS: 40.0000 mg | ORAL_CAPSULE | Freq: Every day | ORAL | Status: DC

## 2016-07-01 NOTE — ED Notes (Signed)
Pt discharged home. Discharged instructions read to pt who verbalized understanding. All belongings returned to pt who signed for same. Denies SI/HI, is not delusional and not responding to internal stimuli. Escorted pt to the ED exit.    
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
## 2016-07-01 NOTE — Discharge Instructions (Signed)
For your ongoing behavioral health needs, you are advised to follow up with [facility]: ° °     [facility] °     [address] °     [place], [place] [address] °     [phone] °
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
## 2016-07-01 NOTE — BH Assessment (Signed)
BHH Assessment Progress Note  Per Thedore Mins, MD, this pt does not require psychiatric hospitalization at this time.  Pt presents under IVC initiated by EDP Bethann Berkshire, MD at [facility] ED, which Dr Jannifer Franklin has rescinded.  Pt is to be discharged from [facility] with recommendation to follow up with [facility] in [place].  This has been included in pt's discharge instructions.  Pt's nurse, Diane, has been notified.  Doylene Canning, MA Triage Specialist [phone]

## 2017-04-12 ENCOUNTER — Other Ambulatory Visit (HOSPITAL_COMMUNITY): Payer: Self-pay | Admitting: Respiratory Therapy

## 2017-04-12 DIAGNOSIS — J441 Chronic obstructive pulmonary disease with (acute) exacerbation: Secondary | ICD-10-CM

## 2017-04-27 ENCOUNTER — Ambulatory Visit (HOSPITAL_COMMUNITY): Admission: RE | Admit: 2017-04-27 | Payer: Medicaid Other | Source: Ambulatory Visit

## 2017-05-04 ENCOUNTER — Inpatient Hospital Stay (HOSPITAL_COMMUNITY): Admission: RE | Admit: 2017-05-04 | Payer: Medicaid Other | Source: Ambulatory Visit

## 2017-06-15 ENCOUNTER — Encounter (HOSPITAL_COMMUNITY): Payer: Self-pay

## 2018-11-06 DIAGNOSIS — F3130 Bipolar disorder, current episode depressed, mild or moderate severity, unspecified: Secondary | ICD-10-CM | POA: Insufficient documentation

## 2019-05-02 ENCOUNTER — Other Ambulatory Visit: Payer: Self-pay

## 2019-05-02 ENCOUNTER — Ambulatory Visit (HOSPITAL_COMMUNITY): Payer: Self-pay | Admitting: Psychiatry

## 2019-05-10 ENCOUNTER — Other Ambulatory Visit: Payer: Self-pay

## 2019-05-10 ENCOUNTER — Encounter (HOSPITAL_COMMUNITY): Payer: Self-pay | Admitting: Psychiatry

## 2019-05-10 ENCOUNTER — Ambulatory Visit (INDEPENDENT_AMBULATORY_CARE_PROVIDER_SITE_OTHER): Payer: Medicaid Other | Admitting: Psychiatry

## 2019-05-10 DIAGNOSIS — F3130 Bipolar disorder, current episode depressed, mild or moderate severity, unspecified: Secondary | ICD-10-CM | POA: Diagnosis not present

## 2019-05-10 DIAGNOSIS — F4321 Adjustment disorder with depressed mood: Secondary | ICD-10-CM | POA: Diagnosis not present

## 2019-05-10 DIAGNOSIS — F411 Generalized anxiety disorder: Secondary | ICD-10-CM | POA: Diagnosis not present

## 2019-05-10 MED ORDER — LAMOTRIGINE 25 MG PO TABS: 25.0000 mg | ORAL_TABLET | Freq: Every day | ORAL | 0 refills | Status: AC
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
## 2019-06-05 ENCOUNTER — Ambulatory Visit (HOSPITAL_COMMUNITY): Payer: Medicaid Other | Admitting: Psychiatry

## 2020-02-26 ENCOUNTER — Ambulatory Visit: Payer: Medicaid Other

## 2020-03-16 ENCOUNTER — Ambulatory Visit: Payer: Medicaid Other

## 2020-04-22 ENCOUNTER — Other Ambulatory Visit: Payer: Self-pay

## 2020-04-22 ENCOUNTER — Ambulatory Visit: Payer: Medicaid Other | Attending: Nurse Practitioner

## 2020-04-22 DIAGNOSIS — R293 Abnormal posture: Secondary | ICD-10-CM | POA: Diagnosis present

## 2020-04-22 DIAGNOSIS — M5441 Lumbago with sciatica, right side: Secondary | ICD-10-CM | POA: Diagnosis present

## 2020-04-22 DIAGNOSIS — G8929 Other chronic pain: Secondary | ICD-10-CM

## 2020-04-22 DIAGNOSIS — M5442 Lumbago with sciatica, left side: Secondary | ICD-10-CM | POA: Diagnosis present

## 2020-04-22 DIAGNOSIS — R262 Difficulty in walking, not elsewhere classified: Secondary | ICD-10-CM | POA: Diagnosis present

## 2020-04-22 DIAGNOSIS — M6281 Muscle weakness (generalized): Secondary | ICD-10-CM

## 2020-04-22 DIAGNOSIS — M79609 Pain in unspecified limb: Secondary | ICD-10-CM | POA: Diagnosis present
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
## 2020-04-30 ENCOUNTER — Telehealth: Payer: Self-pay

## 2020-04-30 ENCOUNTER — Ambulatory Visit: Payer: Medicaid Other

## 2020-04-30 NOTE — Telephone Encounter (Signed)
Spoke to pt re: no show appt. Pt reports she is having a conflict with her work schedule and will not be able to attend PT and request DC.

## 2020-05-30 ENCOUNTER — Emergency Department (HOSPITAL_COMMUNITY)
Admission: EM | Admit: 2020-05-30 | Discharge: 2020-05-30 | Disposition: A | Discharge status: Home / Self Care | Payer: Medicaid Other | Attending: Emergency Medicine | Admitting: Emergency Medicine

## 2020-05-30 ENCOUNTER — Other Ambulatory Visit: Payer: Self-pay

## 2020-05-30 ENCOUNTER — Encounter (HOSPITAL_COMMUNITY): Payer: Self-pay | Admitting: Emergency Medicine

## 2020-05-30 ENCOUNTER — Emergency Department (HOSPITAL_COMMUNITY): Payer: Medicaid Other

## 2020-05-30 DIAGNOSIS — Y9289 Other specified places as the place of occurrence of the external cause: Secondary | ICD-10-CM | POA: Insufficient documentation

## 2020-05-30 DIAGNOSIS — S8011XA Contusion of right lower leg, initial encounter: Secondary | ICD-10-CM | POA: Insufficient documentation

## 2020-05-30 DIAGNOSIS — F17200 Nicotine dependence, unspecified, uncomplicated: Secondary | ICD-10-CM | POA: Diagnosis not present

## 2020-05-30 DIAGNOSIS — S60222A Contusion of left hand, initial encounter: Secondary | ICD-10-CM | POA: Diagnosis not present

## 2020-05-30 DIAGNOSIS — S0512XA Contusion of eyeball and orbital tissues, left eye, initial encounter: Secondary | ICD-10-CM | POA: Insufficient documentation

## 2020-05-30 DIAGNOSIS — S60221A Contusion of right hand, initial encounter: Secondary | ICD-10-CM | POA: Diagnosis not present

## 2020-05-30 DIAGNOSIS — J449 Chronic obstructive pulmonary disease, unspecified: Secondary | ICD-10-CM | POA: Insufficient documentation

## 2020-05-30 DIAGNOSIS — S8012XA Contusion of left lower leg, initial encounter: Secondary | ICD-10-CM | POA: Diagnosis not present

## 2020-05-30 DIAGNOSIS — S8991XA Unspecified injury of right lower leg, initial encounter: Secondary | ICD-10-CM | POA: Diagnosis present

## 2020-05-30 DIAGNOSIS — T1490XA Injury, unspecified, initial encounter: Secondary | ICD-10-CM

## 2020-05-30 DIAGNOSIS — T07XXXA Unspecified multiple injuries, initial encounter: Secondary | ICD-10-CM

## 2020-05-30 MED ORDER — OXYCODONE HCL 5 MG PO TABS
10.0000 mg | ORAL_TABLET | Freq: Once | ORAL | Status: AC
  Administered 2020-05-30: 10 mg via ORAL
  Filled 2020-05-30: qty 2

## 2020-05-30 NOTE — Discharge Instructions (Signed)
All of your CT scans and x-rays show no signs of broken bones, no signs of bleeding on the brain, no signs of fractures.  You will need to follow-up with your pain clinic for ongoing pain control or take over-the-counter medication such as ibuprofen, please avoid Tylenol given your history of liver problems in relation to Tylenol.  You will likely hurt for another couple of weeks but it should gradually improve
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
## 2020-05-30 NOTE — ED Notes (Signed)
Pt states pain is beginning to "ease off" however complaints of mild nausea due to not eating prior to getting medication. Pt asked for crackers which were given at this time.

## 2020-06-01 ENCOUNTER — Ambulatory Visit: Payer: Medicaid Other | Admitting: Physical Therapy

## 2020-09-02 ENCOUNTER — Emergency Department (HOSPITAL_COMMUNITY)
Admission: EM | Admit: 2020-09-02 | Discharge: 2020-09-02 | Disposition: A | Discharge status: Home / Self Care | Payer: Medicaid Other | Attending: Emergency Medicine | Admitting: Emergency Medicine

## 2020-09-02 ENCOUNTER — Encounter (HOSPITAL_COMMUNITY): Payer: Self-pay

## 2020-09-02 ENCOUNTER — Emergency Department (HOSPITAL_COMMUNITY): Payer: Medicaid Other

## 2020-09-02 DIAGNOSIS — R059 Cough, unspecified: Secondary | ICD-10-CM | POA: Diagnosis not present

## 2020-09-02 DIAGNOSIS — J449 Chronic obstructive pulmonary disease, unspecified: Secondary | ICD-10-CM | POA: Diagnosis not present

## 2020-09-02 DIAGNOSIS — R079 Chest pain, unspecified: Secondary | ICD-10-CM | POA: Diagnosis present

## 2020-09-02 DIAGNOSIS — Z79899 Other long term (current) drug therapy: Secondary | ICD-10-CM | POA: Diagnosis not present

## 2020-09-02 DIAGNOSIS — R110 Nausea: Secondary | ICD-10-CM | POA: Diagnosis not present

## 2020-09-02 DIAGNOSIS — R0602 Shortness of breath: Secondary | ICD-10-CM | POA: Insufficient documentation

## 2020-09-02 DIAGNOSIS — R0789 Other chest pain: Secondary | ICD-10-CM | POA: Diagnosis not present

## 2020-09-02 DIAGNOSIS — F17210 Nicotine dependence, cigarettes, uncomplicated: Secondary | ICD-10-CM | POA: Insufficient documentation

## 2020-09-02 LAB — CBC
HCT: 44.4 % (ref 36.0–46.0)
Hemoglobin: 15.2 g/dL — ABNORMAL HIGH (ref 12.0–15.0)
MCH: 32 pg (ref 26.0–34.0)
MCHC: 34.2 g/dL (ref 30.0–36.0)
MCV: 93.5 fL (ref 80.0–100.0)
Platelets: 308 10*3/uL (ref 150–400)
RBC: 4.75 MIL/uL (ref 3.87–5.11)
RDW: 13.4 % (ref 11.5–15.5)
WBC: 8.1 10*3/uL (ref 4.0–10.5)
nRBC: 0 % (ref 0.0–0.2)

## 2020-09-02 LAB — COMPREHENSIVE METABOLIC PANEL
ALT: 48 U/L — ABNORMAL HIGH (ref 0–44)
AST: 31 U/L (ref 15–41)
Albumin: 3.8 g/dL (ref 3.5–5.0)
Alkaline Phosphatase: 191 U/L — ABNORMAL HIGH (ref 38–126)
Anion gap: 8 (ref 5–15)
BUN: 11 mg/dL (ref 6–20)
CO2: 19 mmol/L — ABNORMAL LOW (ref 22–32)
Calcium: 9.1 mg/dL (ref 8.9–10.3)
Chloride: 109 mmol/L (ref 98–111)
Creatinine, Ser: 0.73 mg/dL (ref 0.44–1.00)
GFR, Estimated: >60 mL/min (ref >60)
Glucose, Bld: 95 mg/dL (ref 70–99)
Potassium: 4 mmol/L (ref 3.5–5.1)
Sodium: 136 mmol/L (ref 135–145)
Total Bilirubin: 0.6 mg/dL (ref 0.3–1.2)
Total Protein: 7.1 g/dL (ref 6.5–8.1)

## 2020-09-02 LAB — D-DIMER, QUANTITATIVE: D-Dimer, Quant: 0.31 ug/mL-FEU (ref 0.00–0.50)

## 2020-09-02 LAB — TROPONIN I (HIGH SENSITIVITY): Troponin I (High Sensitivity): 2 ng/L (ref <18)

## 2020-09-02 LAB — LIPASE, BLOOD: Lipase: 30 U/L (ref 11–51)

## 2020-09-02 MED ORDER — DICLOFENAC SODIUM 1 % EX GEL: 2.0000 g | Freq: Four times a day (QID) | CUTANEOUS | 0 refills | Status: AC

## 2020-09-02 MED ORDER — SODIUM CHLORIDE 0.9 % IV BOLUS: 1000.0000 mL | Freq: Once | INTRAVENOUS | Status: DC

## 2020-09-02 MED ORDER — HYDROXYZINE HCL 25 MG PO TABS
25.0000 mg | ORAL_TABLET | Freq: Once | ORAL | Status: AC
  Administered 2020-09-02: 25 mg via ORAL
  Filled 2020-09-02: qty 1

## 2020-09-02 NOTE — ED Notes (Signed)
Patient transported to X-ray 

## 2020-09-02 NOTE — Discharge Instructions (Addendum)
Use the voltaren as recommended for your pain  Please follow up with your primary care provider within 5-7 days for re-evaluation of your symptoms. If you do not have a primary care provider, information for a healthcare clinic has been provided for you to make arrangements for follow up care. Please return to the emergency department for any new or worsening symptoms.

## 2020-09-02 NOTE — ED Provider Notes (Signed)
[name] EMERGENCY DEPARTMENT Provider Note   CSN: [id number] Arrival date & time: 09/02/20  1318     History Chief Complaint  Patient presents with   Chest Pain    Emma Conner is a 40 y.o. female.  HPI   40 y/o female with a h/o anxiety, bipolar 1 disorder, copd, ddd, depression who presents to the ED today for eval of chest pain. Reports cp started intermittently 3 days ago but has since been constant. Reports pain is left sided and is rated 9/10. Pain feels sharp and is worse with inspiration. States her husband recently passed and she took a dose of his ativan yesterday without relief. She does not know if her pain is related to stress as she has had pain in her chest with stress in the past. She reports associated sob, nausea, but denies vomiting. Reports some recent cough. Denies any new leg swelling.   Denies etoh use. Uses marijuana. Denies si.   Past Medical History:  Diagnosis Date   Anxiety    Bipolar 1 disorder ([place])    COPD (chronic obstructive pulmonary disease) ([place])    Pt states she thinks she has either COPD or it is lung cancer.   DDD (degenerative disc disease)    Depression     Patient Active Problem List   Diagnosis Date Noted   Bipolar affective disorder, current episode depressed ([place]) 11/06/2018   Bipolar affective disorder, mixed, mild ([place]) 07/01/2016    Past Surgical History:  Procedure Laterality Date   ABDOMINAL HYSTERECTOMY  11-14-07     OB History   No obstetric history on file.     Family History  Problem Relation Age of Onset   Hypertension Mother    Hypertension Father     Social History   Tobacco Use   Smoking status: Some Days    Packs/day: 0.50    Pack years: 0.00    Types: Cigarettes   Smokeless tobacco: Never  Vaping Use   Vaping Use: Never used  Substance Use Topics   Alcohol use: Yes   Drug use: Yes    Types: Marijuana    Home Medications Prior to Admission medications   Medication Sig Start Date  End Date Taking? Authorizing Provider  diclofenac Sodium (VOLTAREN) 1 % GEL Apply 2 g topically 4 (four) times daily. 09/02/20  Yes Trudie Cervantes S, PA-C  albuterol (PROVENTIL HFA;VENTOLIN HFA) 108 (90 Base) MCG/ACT inhaler Inhale 1-2 puffs into the lungs every 6 (six) hours as needed for wheezing or shortness of breath.  11/16/15   [provider]  baclofen (LIORESAL) 10 MG tablet Take 10 mg by mouth 3 (three) times daily.    [provider]  carbamazepine (TEGRETOL XR) 200 MG 12 hr tablet Take 1 tablet (200 mg total) by mouth 2 (two) times daily. Patient not taking: Reported on 04/22/2020 07/01/16   Patrecia Pour, NP  FLUoxetine (PROZAC) 40 MG capsule Take 1 capsule (40 mg total) by mouth daily. Patient not taking: Reported on 04/22/2020 07/02/16   Patrecia Pour, NP  gabapentin (NEURONTIN) 300 MG capsule Take 300 mg by mouth 2 (two) times daily.    [provider]  hydrOXYzine (VISTARIL) 100 MG capsule Take 100 mg by mouth 3 (three) times daily as needed for itching.    [provider]  ibuprofen (ADVIL) 800 MG tablet TAKE 1 TABLET(800 MG) BY MOUTH EVERY 8 HOURS AS NEEDED FOR PAIN Patient not taking: Reported on 04/22/2020 02/19/19  [provider]  lamoTRIgine (LAMICTAL) 25 MG tablet Take 1 tablet (25 mg total) by mouth daily. Take one tablet daily for a week and then start taking 2 tablets. Patient not taking: Reported on 04/22/2020 05/10/19   Merian Capron, MD  lisdexamfetamine (VYVANSE) 40 MG capsule Take 40 mg by mouth every morning.    [provider]  loratadine (CLARITIN) 10 MG tablet Take 10 mg by mouth daily.    [provider]  montelukast (SINGULAIR) 10 MG tablet TAKE 1 TABLET(10 MG) BY MOUTH DAILY 01/30/19   [provider]  ondansetron (ZOFRAN) 8 MG tablet Take by mouth every 8 (eight) hours as needed for nausea or vomiting.    [provider]  SUMAtriptan (IMITREX) 50 MG tablet Take 50 mg by mouth every  2 (two) hours as needed for migraine. May repeat in 2 hours if headache persists or recurs.    [provider]  traZODone (DESYREL) 50 MG tablet Take 1 tablet (50 mg total) by mouth at bedtime and may repeat dose one time if needed. Patient not taking: Reported on 04/22/2020 07/01/16   Patrecia Pour, NP  vortioxetine HBr (TRINTELLIX) 20 MG TABS tablet TAKE 1 TABLET(20 MG) BY MOUTH DAILY 05/08/19   [provider]  zolpidem (AMBIEN) 5 MG tablet Take 5 mg by mouth once.    [provider]    Allergies    Benadryl [diphenhydramine], Penicillins, Tegretol [carbamazepine], and Ultram [tramadol]  Review of Systems   Review of Systems  Constitutional:  Negative for chills and fever.  HENT:  Negative for ear pain and sore throat.   Eyes:  Negative for visual disturbance.  Respiratory:  Negative for shortness of breath.   Cardiovascular:  Positive for chest pain and leg swelling (chronic, unchanged).  Gastrointestinal:  Positive for nausea. Negative for abdominal pain, constipation, diarrhea and vomiting.  Genitourinary:  Negative for dysuria and hematuria.  Musculoskeletal:  Negative for back pain.  Skin:  Negative for color change and rash.  Neurological:  Negative for seizures and syncope.  All other systems reviewed and are negative.  Physical Exam Updated Vital Signs BP 106/75   Pulse 76   Temp 98.5 F (36.9 C) (Oral)   Resp 14   Ht 5' 7"  (1.702 m)   Wt 98.9 kg   SpO2 97%   BMI 34.14 kg/m   Physical Exam Vitals and nursing note reviewed.  Constitutional:      General: She is not in acute distress.    Appearance: She is well-developed.  HENT:     Head: Normocephalic and atraumatic.  Eyes:     Conjunctiva/sclera: Conjunctivae normal.  Cardiovascular:     Rate and Rhythm: Normal rate and regular rhythm.     Heart sounds: Normal heart sounds. No murmur heard. Pulmonary:     Effort: Pulmonary effort is normal. No respiratory distress.     Breath  sounds: Normal breath sounds. No decreased breath sounds, wheezing, rhonchi or rales.  Abdominal:     Palpations: Abdomen is soft.     Tenderness: There is no abdominal tenderness.  Musculoskeletal:     Cervical back: Neck supple.  Skin:    General: Skin is warm and dry.  Neurological:     Mental Status: She is alert.    ED Results / Procedures / Treatments   Labs (all labs ordered are listed, but only abnormal results are displayed) Labs Reviewed  CBC - Abnormal; Notable for the following components:  Result Value   Hemoglobin 15.2 (*)    All other components within normal limits  COMPREHENSIVE METABOLIC PANEL - Abnormal; Notable for the following components:   CO2 19 (*)    ALT 48 (*)    Alkaline Phosphatase 191 (*)    All other components within normal limits  LIPASE, BLOOD  D-DIMER, QUANTITATIVE  TROPONIN I (HIGH SENSITIVITY)  TROPONIN I (HIGH SENSITIVITY)    EKG None  Radiology DG Chest 2 View  Result Date: 09/02/2020 CLINICAL DATA:  Chest pain EXAM: CHEST - 2 VIEW COMPARISON:  05/30/2020 FINDINGS: The heart size and mediastinal contours are within normal limits. Both lungs are clear. The visualized skeletal structures are unremarkable. IMPRESSION: No active cardiopulmonary disease. Electronically Signed   By: Donavan Foil M.D.   On: 09/02/2020 14:50    Procedures Procedures   Medications Ordered in ED Medications  sodium chloride 0.9 % bolus 1,000 mL (1,000 mLs Intravenous Not Given 09/02/20 1502)  hydrOXYzine (ATARAX/VISTARIL) tablet 25 mg (25 mg Oral Given 09/02/20 1436)    ED Course  I have reviewed the triage vital signs and the nursing notes.  Pertinent labs & imaging results that were available during my care of the patient were reviewed by me and considered in my medical decision making (see chart for details).    MDM Rules/Calculators/A&P                          40 y/o F presents for eval of chest pain that started a few days  ago  Reviewed/interpreted labs Cbc w/o leukocytosis, hgb elevated which may be due to hemoconcentration from mild dehydration  - ordered 1l ns and pt declined Cmp with mildly elevated alt and alk phos, otherwise reassuring Lipase neg Trop neg, do not feel that repeat trop needs to be collected as pain has been persistent and does not sound typical for ACS Ddimer neg, doubt pe  EKG - with NSR, no acute ischemic changes  Reviewed/interpreted CXR - No active cardiopulmonary disease.  Pts cardiac w/u is negative. No evidence of pe and I doubt dissection, esophageal rupture or other emergent cardiopulmonary or vascular cause. She does have reproducible ttp to the chest all which increases suspicion that this is likely msk related. She is given rx for voltaren gel for home. She was also given hydroxyzine here for her anxiety. Advised on f/u with her pcp in regards to her sxs, including her current grief regarding the loss of her husband. Without si or other severe psychiatric sxs, she does not appear to require an emergent psychiatric consultation today. Have advised on return precautions. She voices understanding of the plan and reasons to return. All questions answered, pt stable for discharge.  Final Clinical Impression(s) / ED Diagnoses Final diagnoses:  Atypical chest pain    Rx / DC Orders ED Discharge Orders          Ordered    diclofenac Sodium (VOLTAREN) 1 % GEL  4 times daily        09/02/20 1516             Bonifacio Pruden, [place], PA-C 09/02/20 1525    Wyvonnia Dusky, MD 09/02/20 1702

## 2020-09-02 NOTE — ED Triage Notes (Signed)
Pt. Arrives via EMS from home. Pt. Complains of left chest pain that radiates to right side. Pt. States the pain has been ongoing for 12 hours. Pt. Self administered 324 mg of aspirin.

## 2020-09-02 NOTE — ED Notes (Addendum)
Pt. Left ED without receiving discharge paperwork. Pt. Ambulated with steady gait.

## 2020-10-26 ENCOUNTER — Emergency Department (HOSPITAL_COMMUNITY)
Admission: EM | Admit: 2020-10-26 | Discharge: 2020-10-26 | Disposition: A | Discharge status: Home / Self Care | Payer: Medicaid Other | Attending: Emergency Medicine | Admitting: Emergency Medicine

## 2020-10-26 ENCOUNTER — Other Ambulatory Visit: Payer: Self-pay

## 2020-10-26 ENCOUNTER — Encounter (HOSPITAL_COMMUNITY): Payer: Self-pay | Admitting: *Deleted

## 2020-10-26 ENCOUNTER — Emergency Department (HOSPITAL_COMMUNITY): Payer: Medicaid Other

## 2020-10-26 DIAGNOSIS — W1839XA Other fall on same level, initial encounter: Secondary | ICD-10-CM | POA: Diagnosis not present

## 2020-10-26 DIAGNOSIS — S62101A Fracture of unspecified carpal bone, right wrist, initial encounter for closed fracture: Secondary | ICD-10-CM

## 2020-10-26 DIAGNOSIS — S6991XA Unspecified injury of right wrist, hand and finger(s), initial encounter: Secondary | ICD-10-CM | POA: Diagnosis present

## 2020-10-26 DIAGNOSIS — F17210 Nicotine dependence, cigarettes, uncomplicated: Secondary | ICD-10-CM | POA: Diagnosis not present

## 2020-10-26 DIAGNOSIS — J449 Chronic obstructive pulmonary disease, unspecified: Secondary | ICD-10-CM | POA: Diagnosis not present

## 2020-10-26 DIAGNOSIS — S62111A Displaced fracture of triquetrum [cuneiform] bone, right wrist, initial encounter for closed fracture: Secondary | ICD-10-CM | POA: Diagnosis not present

## 2020-10-26 DIAGNOSIS — Z23 Encounter for immunization: Secondary | ICD-10-CM | POA: Diagnosis not present

## 2020-10-26 DIAGNOSIS — S61216A Laceration without foreign body of right little finger without damage to nail, initial encounter: Secondary | ICD-10-CM | POA: Diagnosis not present

## 2020-10-26 MED ORDER — TETANUS-DIPHTH-ACELL PERTUSSIS 5-2.5-18.5 LF-MCG/0.5 IM SUSY
0.5000 mL | PREFILLED_SYRINGE | Freq: Once | INTRAMUSCULAR | Status: AC
  Administered 2020-10-26: 0.5 mL via INTRAMUSCULAR
  Filled 2020-10-26: qty 0.5

## 2020-10-26 MED ORDER — POVIDONE-IODINE 10 % EX SOLN
CUTANEOUS | Status: AC
  Filled 2020-10-26: qty 30

## 2020-10-26 MED ORDER — DOXYCYCLINE HYCLATE 100 MG PO TABS
100.0000 mg | ORAL_TABLET | Freq: Once | ORAL | Status: AC
  Administered 2020-10-26: 100 mg via ORAL
  Filled 2020-10-26: qty 1

## 2020-10-26 MED ORDER — FENTANYL CITRATE PF 50 MCG/ML IJ SOSY
50.0000 ug | PREFILLED_SYRINGE | Freq: Once | INTRAMUSCULAR | Status: AC
  Administered 2020-10-26: 50 ug via INTRAMUSCULAR
  Filled 2020-10-26: qty 1

## 2020-10-26 MED ORDER — OXYCODONE-ACETAMINOPHEN 5-325 MG PO TABS: 1.0000 | ORAL_TABLET | Freq: Three times a day (TID) | ORAL | 0 refills | Status: AC | PRN

## 2020-10-26 MED ORDER — DOXYCYCLINE HYCLATE 100 MG PO CAPS: 100.0000 mg | ORAL_CAPSULE | Freq: Two times a day (BID) | ORAL | 0 refills | Status: AC

## 2020-10-26 MED ORDER — FENTANYL CITRATE PF 50 MCG/ML IJ SOSY: 50.0000 ug | PREFILLED_SYRINGE | Freq: Once | INTRAMUSCULAR | Status: DC

## 2020-10-26 MED ORDER — LIDOCAINE HCL (PF) 1 % IJ SOLN
5.0000 mL | Freq: Once | INTRAMUSCULAR | Status: AC
  Administered 2020-10-26: 5 mL
  Filled 2020-10-26: qty 30

## 2020-10-26 NOTE — ED Triage Notes (Signed)
Patient also c/o pain in left ear due to old injury

## 2020-10-26 NOTE — ED Notes (Signed)
Attempting to take patients blood pressure.  Patient ripped the blood pressure cuff off and threw it.  She stated "I'm not fucking doing this shit, its leaving red marks on my arm."  Apologized to the patient.  Patient states "fuck this shit, I've been waiting for hours to be seen and haven't received anything."  Explained that the providers have been busy.  Patient began to yell.  Left the patient room.

## 2020-10-26 NOTE — ED Triage Notes (Signed)
States she fell on cinder blocks last night. C/o pain in right pinky finger, right wrist and right forearm

## 2020-10-26 NOTE — Discharge Instructions (Addendum)
You have a laceration on your right pinky finger, I recommend rinse out the wound and change out the dressings 2 times daily for the next 2 weeks.  I also recommend applying antiseptic ointment like bacitracin on it daily.  I have started you on antibiotics based take as prescribed.  Your wrist has a fracture in it, I placed you in a splint please leave on.  You must follow-up with orthopedic surgery for further evaluation.  Given contact information above please call  I have given you a short course of narcotics please take as prescribed.  This medication can make you drowsy do not consume alcohol or operate heavy machinery when taking this medication.  This medication is Tylenol in it do not take Tylenol and take this medication.

## 2020-10-26 NOTE — ED Provider Notes (Signed)
[facility] EMERGENCY DEPARTMENT Provider Note   CSN: [id number] Arrival date & time: 10/26/20  1726     History Chief Complaint  Patient presents with   Finger Injury    Emma Conner is a 40 y.o. female.  HPI  Patient with significant medical history of anxiety, bipolar, COPD, depression presents to the emergency department with chief complaint of right arm, wrist, hand pain.  She states that she fell last night, states she stepped backwards missed a step and fell onto her back.  States that her hand hit the ground, she denies hitting her head, losing conscious, is not on anticoagulant.  States that she has pain in her right arm, mostly in  her right forearm as well as her wrist.  She denies paresthesias or weakness, she states she has difficulty bending her pinky finger as well as wrist due to the pain.  She does not endorse headaches, change in vision, paresthesias or weakness upper lower extremities, denies neck pain, back pain, chest pain, abdominal pain.  She is not immunocompromise, does not really last time she had tetanus shot.  Past Medical History:  Diagnosis Date   Anxiety    Bipolar 1 disorder ([facility])    COPD (chronic obstructive pulmonary disease) ([facility])    Pt states she thinks she has either COPD or it is lung cancer.   DDD (degenerative disc disease)    Depression     Patient Active Problem List   Diagnosis Date Noted   Bipolar affective disorder, current episode depressed ([facility]) 11/06/2018   Bipolar affective disorder, mixed, mild ([facility]) 07/01/2016    Past Surgical History:  Procedure Laterality Date   ABDOMINAL HYSTERECTOMY  11-14-07     OB History   No obstetric history on file.     Family History  Problem Relation Age of Onset   Hypertension Mother    Hypertension Father     Social History   Tobacco Use   Smoking status: Some Days    Packs/day: 0.50    Types: Cigarettes   Smokeless tobacco: Never  Vaping Use   Vaping Use: Never used   Substance Use Topics   Alcohol use: Yes   Drug use: Yes    Types: Marijuana    Home Medications Prior to Admission medications   Medication Sig Start Date End Date Taking? Authorizing Provider  doxycycline (VIBRAMYCIN) 100 MG capsule Take 1 capsule (100 mg total) by mouth 2 (two) times daily for 7 days. 10/26/20 11/02/20 Yes Carroll Sage, PA-C  oxyCODONE-acetaminophen (PERCOCET/ROXICET) 5-325 MG tablet Take 1 tablet by mouth every 8 (eight) hours as needed for up to 2 days for severe pain. 10/26/20 10/28/20 Yes Carroll Sage, PA-C  oxyCODONE-acetaminophen (PERCOCET/ROXICET) 5-325 MG tablet Take 1 tablet by mouth every 8 (eight) hours as needed for up to 2 days for severe pain. 10/26/20 10/28/20 Yes Carroll Sage, PA-C  albuterol (PROVENTIL HFA;VENTOLIN HFA) 108 (90 Base) MCG/ACT inhaler Inhale 1-2 puffs into the lungs every 6 (six) hours as needed for wheezing or shortness of breath.  11/16/15   [provider]  baclofen (LIORESAL) 10 MG tablet Take 10 mg by mouth 3 (three) times daily.    [provider]  carbamazepine (TEGRETOL XR) 200 MG 12 hr tablet Take 1 tablet (200 mg total) by mouth 2 (two) times daily. Patient not taking: Reported on 04/22/2020 07/01/16   Charm Rings, NP  diclofenac Sodium (VOLTAREN) 1 % GEL Apply 2 g topically 4 (four)  times daily. 09/02/20   Couture, Cortni S, PA-C  FLUoxetine (PROZAC) 40 MG capsule Take 1 capsule (40 mg total) by mouth daily. Patient not taking: Reported on 04/22/2020 07/02/16   Charm Rings, NP  gabapentin (NEURONTIN) 300 MG capsule Take 300 mg by mouth 2 (two) times daily.    [provider]  hydrOXYzine (VISTARIL) 100 MG capsule Take 100 mg by mouth 3 (three) times daily as needed for itching.    [provider]  ibuprofen (ADVIL) 800 MG tablet TAKE 1 TABLET(800 MG) BY MOUTH EVERY 8 HOURS AS NEEDED FOR PAIN Patient not taking: Reported on 04/22/2020 02/19/19   [provider]   lamoTRIgine (LAMICTAL) 25 MG tablet Take 1 tablet (25 mg total) by mouth daily. Take one tablet daily for a week and then start taking 2 tablets. Patient not taking: Reported on 04/22/2020 05/10/19   Thresa Ross, MD  lisdexamfetamine (VYVANSE) 40 MG capsule Take 40 mg by mouth every morning.    [provider]  loratadine (CLARITIN) 10 MG tablet Take 10 mg by mouth daily.    [provider]  montelukast (SINGULAIR) 10 MG tablet TAKE 1 TABLET(10 MG) BY MOUTH DAILY 01/30/19   [provider]  ondansetron (ZOFRAN) 8 MG tablet Take by mouth every 8 (eight) hours as needed for nausea or vomiting.    [provider]  SUMAtriptan (IMITREX) 50 MG tablet Take 50 mg by mouth every 2 (two) hours as needed for migraine. May repeat in 2 hours if headache persists or recurs.    [provider]  traZODone (DESYREL) 50 MG tablet Take 1 tablet (50 mg total) by mouth at bedtime and may repeat dose one time if needed. Patient not taking: Reported on 04/22/2020 07/01/16   Charm Rings, NP  vortioxetine HBr (TRINTELLIX) 20 MG TABS tablet TAKE 1 TABLET(20 MG) BY MOUTH DAILY 05/08/19   [provider]  zolpidem (AMBIEN) 5 MG tablet Take 5 mg by mouth once.    [provider]    Allergies    Benadryl [diphenhydramine], Penicillins, Tegretol [carbamazepine], and Ultram [tramadol]  Review of Systems   Review of Systems  Constitutional:  Negative for chills and fever.  HENT:  Negative for congestion.   Respiratory:  Negative for shortness of breath.   Cardiovascular:  Negative for chest pain.  Gastrointestinal:  Negative for abdominal pain.  Genitourinary:  Negative for enuresis.  Musculoskeletal:  Negative for back pain.       Right forearm, wrist, right pinky finger pain  Skin:  Negative for rash.  Neurological:  Negative for headaches.  Hematological:  Does not bruise/bleed easily.   Physical Exam Updated Vital Signs BP 122/90 (BP Location: Left  Arm)   Pulse 96   Temp 98.5 F (36.9 C) (Oral)   Resp 20   Ht 5\' 8"  (1.727 m)   Wt 93.9 kg   SpO2 97%   BMI 31.47 kg/m   Physical Exam Vitals and nursing note reviewed.  Constitutional:      General: She is not in acute distress.    Appearance: She is not ill-appearing.  HENT:     Head: Normocephalic and atraumatic.     Nose: No congestion.  Eyes:     Conjunctiva/sclera: Conjunctivae normal.  Cardiovascular:     Rate and Rhythm: Normal rate and regular rhythm.     Pulses: Normal pulses.     Heart sounds: No murmur heard.   No friction rub. No gallop.  Pulmonary:     Effort: Pulmonary effort is normal.     Breath sounds: Normal breath sounds.  Chest:     Chest wall: No tenderness.  Abdominal:     Palpations: Abdomen is soft.     Tenderness: There is no abdominal tenderness.  Musculoskeletal:     Comments: Spine was palpated was nontender to palpation.  Right arm was visualized she has a noted laceration on the palmar aspect of the pinky finger distally to the MCP joint, measuring approximately 5 cm in length 1 to 2 mm in depth, hemodynamically stable.  There is surrounding erythema no drainage or discharge present, she has decreased range of motion at her DIP PIP and MCP joint she has full range of motion in all her fingers and joints.  Patient limited range of motion at her wrist, full range of motion at her elbow.    Patient was difficult to assess as any attempts to fully evaluate the patient elicited pain.  Skin:    General: Skin is warm and dry.  Neurological:     Mental Status: She is alert.  Psychiatric:        Mood and Affect: Mood normal.         ED Results / Procedures / Treatments   Labs (all labs ordered are listed, but only abnormal results are displayed) Labs Reviewed - No data to display  EKG None  Radiology DG Forearm Right  Result Date: 10/26/2020 CLINICAL DATA:  Heavy object fell on right hand/arm EXAM: RIGHT FOREARM - 2 VIEW  COMPARISON:  None. FINDINGS: There is no evidence of fracture or other focal bone lesions. Soft tissues are unremarkable. IMPRESSION: Negative. Electronically Signed   By: Charlett Nose M.D.   On: 10/26/2020 19:07   DG Hand Complete Right  Result Date: 10/26/2020 CLINICAL DATA:  Heavy object fell on hand EXAM: RIGHT HAND - COMPLETE 3+ VIEW COMPARISON:  None. FINDINGS: Possible small triquetral avulsion fracture noted posteriorly on the lateral view. Recommend correlation for pain in this area. No additional fracture, subluxation or dislocation. No radiopaque foreign bodies. IMPRESSION: Possible triquetral avulsion fracture noted posteriorly within the wrist. Recommend correlation for pain in this area. Electronically Signed   By: Charlett Nose M.D.   On: 10/26/2020 19:08    Procedures Procedures   Medications Ordered in ED Medications  fentaNYL (SUBLIMAZE) injection 50 mcg (50 mcg Intramuscular Given 10/26/20 2129)  lidocaine (PF) (XYLOCAINE) 1 % injection 5 mL (5 mLs Infiltration Given 10/26/20 2232)  Tdap (BOOSTRIX) injection 0.5 mL (0.5 mLs Intramuscular Given 10/26/20 2130)  povidone-iodine (BETADINE) 10 % external solution (  Given 10/26/20 2232)  doxycycline (VIBRA-TABS) tablet 100 mg (100 mg Oral Given 10/26/20 2245)    ED Course  I have reviewed the triage vital signs and the nursing notes.  Pertinent labs & imaging results that were available during my care of the patient were reviewed by me and considered in my medical decision making (see chart for details).    MDM Rules/Calculators/A&P                          Initial impression-patient presents with injury to her right arm.  She is alert, does not appear in acute stress, vital signs reassuring.  Triage obtained imaging will recommend digital block including at the wound for further evaluation.  Work-up-DG of right hand reveals probable triquetral avulsion fracture posteriorly within the wrist.  Forearm is negative for acute  findings.  Reassessment-due to severe pain will recommend digital block for further evaluation.  Patient was agreeable to this.  Tolerated the procedure well, still has a lot of pain but appears that she is able to flex and extend at all joints of her right pinky finger.  Neurovascular fully intact.  No notable foreign bodies present, no visible ligament or tendon damage present.  Rule out-I have low suspicion for open fracture of the right pinky finger as imaging is negative for this.  low suspicion for ligament or tendon damage of the pinky finger as she still has flexion and extension at all joints of her finger, there is  slight decreased range in motion likely due to pain. Low suspicion for compartment syndrome as area was palpated it was soft to the touch, neurovascular fully intact.  Low suspicion for deep tissue infection and/or tenosynovitis as there is no fluctuance or induration present my exam, no erythema or edema tracking up the right hand, also unlikely for infection to start this quickly.   Plan-  Right wrist pain-likely due to  avulsion fracture of the triquetral will place in a wrist splint, follow-up with orthopedic surgery for further evaluation. Right pinky laceration-due to being open for greater than 24 hours will defer closure at this time is increased risk for infection.  will start her on antibiotics follow-up with her PCP for further evaluation.  Vital signs have remained stable, no indication for hospital admission.  Patient discussed with attending and they agreed with assessment and plan.  Patient given at home care as well strict return precautions.  Patient verbalized that they understood agreed to said plan.  Final Clinical Impression(s) / ED Diagnoses Final diagnoses:  Laceration of right little finger without foreign body without damage to nail, initial encounter  Closed fracture of right wrist, initial encounter    Rx / DC Orders ED Discharge Orders           Ordered    doxycycline (VIBRAMYCIN) 100 MG capsule  2 times daily        10/26/20 2309    oxyCODONE-acetaminophen (PERCOCET/ROXICET) 5-325 MG tablet  Every 8 hours PRN        10/26/20 2312    oxyCODONE-acetaminophen (PERCOCET/ROXICET) 5-325 MG tablet  Every 8 hours PRN        10/26/20 2313             Carroll SageFaulkner, Mailey Landstrom J, PA-C 10/26/20 2314    Pollyann SavoySheldon, Charles B, MD 10/26/20 2317

## 2020-10-27 MED FILL — Oxycodone w/ Acetaminophen Tab 5-325 MG: ORAL | Qty: 6 | Status: AC

## 2020-10-28 ENCOUNTER — Ambulatory Visit: Payer: Medicaid Other | Admitting: Orthopedic Surgery

## 2020-10-28 ENCOUNTER — Telehealth: Payer: Self-pay | Admitting: Orthopedic Surgery

## 2020-10-28 NOTE — Telephone Encounter (Signed)
Patient called this morning stating she wanted to cancel her appointment because she didn't have transportation.  When I asked her about rescheduling the appointment she told me no, she didn't want to reschedule.  She said "it's just a broken bone in the middle of my hand and it will be fine."  I asked her again if I could reschedule the appointment for another day.  She told me no and then hung up on me.

## 2020-10-29 ENCOUNTER — Ambulatory Visit: Payer: Medicaid Other | Admitting: Orthopedic Surgery

## 2021-02-19 ENCOUNTER — Emergency Department (HOSPITAL_COMMUNITY): Payer: Medicaid Other

## 2021-02-19 ENCOUNTER — Encounter (HOSPITAL_COMMUNITY): Payer: Self-pay | Admitting: *Deleted

## 2021-02-19 ENCOUNTER — Emergency Department (HOSPITAL_COMMUNITY)
Admission: EM | Admit: 2021-02-19 | Discharge: 2021-02-19 | Disposition: A | Discharge status: Home / Self Care | Payer: Medicaid Other | Attending: Emergency Medicine | Admitting: Emergency Medicine

## 2021-02-19 DIAGNOSIS — D72829 Elevated white blood cell count, unspecified: Secondary | ICD-10-CM | POA: Diagnosis not present

## 2021-02-19 DIAGNOSIS — R9431 Abnormal electrocardiogram [ECG] [EKG]: Secondary | ICD-10-CM | POA: Diagnosis not present

## 2021-02-19 DIAGNOSIS — J449 Chronic obstructive pulmonary disease, unspecified: Secondary | ICD-10-CM | POA: Diagnosis not present

## 2021-02-19 DIAGNOSIS — R0602 Shortness of breath: Secondary | ICD-10-CM | POA: Diagnosis present

## 2021-02-19 DIAGNOSIS — F17210 Nicotine dependence, cigarettes, uncomplicated: Secondary | ICD-10-CM | POA: Diagnosis not present

## 2021-02-19 DIAGNOSIS — J189 Pneumonia, unspecified organism: Secondary | ICD-10-CM | POA: Diagnosis not present

## 2021-02-19 LAB — BASIC METABOLIC PANEL
Anion gap: 13 (ref 5–15)
BUN: 19 mg/dL (ref 6–20)
CO2: 20 mmol/L — ABNORMAL LOW (ref 22–32)
Calcium: 9.5 mg/dL (ref 8.9–10.3)
Chloride: 103 mmol/L (ref 98–111)
Creatinine, Ser: 0.81 mg/dL (ref 0.44–1.00)
GFR, Estimated: >60 mL/min (ref >60)
Glucose, Bld: 97 mg/dL (ref 70–99)
Potassium: 3.8 mmol/L (ref 3.5–5.1)
Sodium: 136 mmol/L (ref 135–145)

## 2021-02-19 LAB — CBC WITH DIFFERENTIAL/PLATELET
Abs Immature Granulocytes: 0.05 10*3/uL (ref 0.00–0.07)
Basophils Absolute: 0.1 10*3/uL (ref 0.0–0.1)
Basophils Relative: 1 %
Eosinophils Absolute: 0.2 10*3/uL (ref 0.0–0.5)
Eosinophils Relative: 1 %
HCT: 46.9 % — ABNORMAL HIGH (ref 36.0–46.0)
Hemoglobin: 16.4 g/dL — ABNORMAL HIGH (ref 12.0–15.0)
Immature Granulocytes: 0 %
Lymphocytes Relative: 31 %
Lymphs Abs: 3.4 10*3/uL (ref 0.7–4.0)
MCH: 33.5 pg (ref 26.0–34.0)
MCHC: 35 g/dL (ref 30.0–36.0)
MCV: 95.9 fL (ref 80.0–100.0)
Monocytes Absolute: 0.8 10*3/uL (ref 0.1–1.0)
Monocytes Relative: 7 %
Neutro Abs: 6.7 10*3/uL (ref 1.7–7.7)
Neutrophils Relative %: 60 %
Platelets: 371 10*3/uL (ref 150–400)
RBC: 4.89 MIL/uL (ref 3.87–5.11)
RDW: 14.5 % (ref 11.5–15.5)
WBC: 11.2 10*3/uL — ABNORMAL HIGH (ref 4.0–10.5)
nRBC: 0 % (ref 0.0–0.2)

## 2021-02-19 LAB — TROPONIN I (HIGH SENSITIVITY)
Troponin I (High Sensitivity): 4 ng/L (ref <18)
Troponin I (High Sensitivity): 3 ng/L (ref <18)

## 2021-02-19 MED ORDER — ALBUTEROL SULFATE HFA 108 (90 BASE) MCG/ACT IN AERS: 1.0000 | INHALATION_SPRAY | Freq: Four times a day (QID) | RESPIRATORY_TRACT | 0 refills | Status: AC | PRN

## 2021-02-19 MED ORDER — BENZONATATE 100 MG PO CAPS: 100.0000 mg | ORAL_CAPSULE | Freq: Three times a day (TID) | ORAL | 0 refills | Status: AC

## 2021-02-19 MED ORDER — ALBUTEROL SULFATE HFA 108 (90 BASE) MCG/ACT IN AERS: 2.0000 | INHALATION_SPRAY | RESPIRATORY_TRACT | Status: DC | PRN

## 2021-02-19 MED ORDER — DOXYCYCLINE HYCLATE 100 MG PO CAPS: 100.0000 mg | ORAL_CAPSULE | Freq: Two times a day (BID) | ORAL | 0 refills | Status: AC

## 2021-02-19 MED ORDER — IOHEXOL 350 MG/ML SOLN
75.0000 mL | Freq: Once | INTRAVENOUS | Status: AC | PRN
  Administered 2021-02-19: 75 mL via INTRAVENOUS

## 2021-02-19 NOTE — ED Triage Notes (Signed)
Short of breath for 2 days

## 2021-02-19 NOTE — ED Provider Notes (Signed)
[facility] EMERGENCY DEPARTMENT Provider Note  CSN: [id number] Arrival date & time: 02/19/21 1440    History Chief Complaint  Patient presents with   Shortness of Breath    Emma Conner is a 40 y.o. female with history of anxiety and COPD reports 2 days of SOB, racing heart and pleuritic chest pain radiating to L upper back with subjective fevers at home. She has been using her inhaler without improvement.    Past Medical History:  Diagnosis Date   Anxiety    Bipolar 1 disorder ([place])    COPD (chronic obstructive pulmonary disease) ([place])    Pt states she thinks she has either COPD or it is lung cancer.   DDD (degenerative disc disease)    Depression     Past Surgical History:  Procedure Laterality Date   ABDOMINAL HYSTERECTOMY  11-14-07    Family History  Problem Relation Age of Onset   Hypertension Mother    Hypertension Father     Social History   Tobacco Use   Smoking status: Some Days    Packs/day: 0.50    Types: Cigarettes   Smokeless tobacco: Never  Vaping Use   Vaping Use: Never used  Substance Use Topics   Alcohol use: Yes   Drug use: Yes    Types: Marijuana     Home Medications Prior to Admission medications   Medication Sig Start Date End Date Taking? Authorizing Provider  albuterol (VENTOLIN HFA) 108 (90 Base) MCG/ACT inhaler Inhale 1-2 puffs into the lungs every 6 (six) hours as needed for wheezing or shortness of breath. 02/19/21   Truddie Hidden, MD  baclofen (LIORESAL) 10 MG tablet Take 10 mg by mouth 3 (three) times daily.    [provider]  benzonatate (TESSALON) 100 MG capsule Take 1 capsule (100 mg total) by mouth every 8 (eight) hours. 02/19/21  Yes Truddie Hidden, MD  carbamazepine (TEGRETOL XR) 200 MG 12 hr tablet Take 1 tablet (200 mg total) by mouth 2 (two) times daily. Patient not taking: Reported on 04/22/2020 07/01/16   Patrecia Pour, NP  diclofenac Sodium (VOLTAREN) 1 % GEL Apply 2 g topically 4 (four)  times daily. 09/02/20   Couture, Cortni S, PA-C  doxycycline (VIBRAMYCIN) 100 MG capsule Take 1 capsule (100 mg total) by mouth 2 (two) times daily. 02/19/21  Yes Truddie Hidden, MD  FLUoxetine (PROZAC) 40 MG capsule Take 1 capsule (40 mg total) by mouth daily. Patient not taking: Reported on 04/22/2020 07/02/16   Patrecia Pour, NP  gabapentin (NEURONTIN) 300 MG capsule Take 300 mg by mouth 2 (two) times daily.    [provider]  hydrOXYzine (VISTARIL) 100 MG capsule Take 100 mg by mouth 3 (three) times daily as needed for itching.    [provider]  ibuprofen (ADVIL) 800 MG tablet TAKE 1 TABLET(800 MG) BY MOUTH EVERY 8 HOURS AS NEEDED FOR PAIN Patient not taking: Reported on 04/22/2020 02/19/19   [provider]  lamoTRIgine (LAMICTAL) 25 MG tablet Take 1 tablet (25 mg total) by mouth daily. Take one tablet daily for a week and then start taking 2 tablets. Patient not taking: Reported on 04/22/2020 05/10/19   Merian Capron, MD  lisdexamfetamine (VYVANSE) 40 MG capsule Take 40 mg by mouth every morning.    [provider]  loratadine (CLARITIN) 10 MG tablet Take 10 mg by mouth daily.    [provider]  montelukast (SINGULAIR) 10 MG tablet TAKE 1  TABLET(10 MG) BY MOUTH DAILY 01/30/19   [provider]  ondansetron (ZOFRAN) 8 MG tablet Take by mouth every 8 (eight) hours as needed for nausea or vomiting.    [provider]  SUMAtriptan (IMITREX) 50 MG tablet Take 50 mg by mouth every 2 (two) hours as needed for migraine. May repeat in 2 hours if headache persists or recurs.    [provider]  traZODone (DESYREL) 50 MG tablet Take 1 tablet (50 mg total) by mouth at bedtime and may repeat dose one time if needed. Patient not taking: Reported on 04/22/2020 07/01/16   Patrecia Pour, NP  vortioxetine HBr (TRINTELLIX) 20 MG TABS tablet TAKE 1 TABLET(20 MG) BY MOUTH DAILY 05/08/19   [provider]  zolpidem (AMBIEN) 5 MG  tablet Take 5 mg by mouth once.    [provider]     Allergies    Benadryl [diphenhydramine], Penicillins, Tegretol [carbamazepine], and Ultram [tramadol]   Review of Systems   Review of Systems A comprehensive review of systems was completed and negative except as noted in HPI.    Physical Exam BP 100/81    Pulse 91    Temp 97.9 F (36.6 C) (Oral)    Resp 20    SpO2 97%   Physical Exam Vitals and nursing note reviewed.  Constitutional:      Appearance: Normal appearance.  HENT:     Head: Normocephalic and atraumatic.     Nose: Nose normal.     Mouth/Throat:     Mouth: Mucous membranes are moist.  Eyes:     Extraocular Movements: Extraocular movements intact.     Conjunctiva/sclera: Conjunctivae normal.  Cardiovascular:     Rate and Rhythm: Normal rate.  Pulmonary:     Effort: Pulmonary effort is normal.     Breath sounds: Examination of the right-lower field reveals rales. Rales present. No wheezing.  Abdominal:     General: Abdomen is flat.     Palpations: Abdomen is soft.     Tenderness: There is no abdominal tenderness.  Musculoskeletal:        General: No swelling. Normal range of motion.     Cervical back: Neck supple.  Skin:    General: Skin is warm and dry.  Neurological:     General: No focal deficit present.     Mental Status: She is alert.  Psychiatric:        Mood and Affect: Mood normal.     ED Results / Procedures / Treatments   Labs (all labs ordered are listed, but only abnormal results are displayed) Labs Reviewed  BASIC METABOLIC PANEL - Abnormal; Notable for the following components:      Result Value   CO2 20 (*)    All other components within normal limits  CBC WITH DIFFERENTIAL/PLATELET - Abnormal; Notable for the following components:   WBC 11.2 (*)    Hemoglobin 16.4 (*)    HCT 46.9 (*)    All other components within normal limits  TROPONIN I (HIGH SENSITIVITY)  TROPONIN I (HIGH SENSITIVITY)    EKG EKG  Interpretation  Date/Time:  Friday February 19 2021 15:08:15 EST Ventricular Rate:  132 PR Interval:  134 QRS Duration: 74 QT Interval:  294 QTC Calculation: 435 R Axis:   94 Text Interpretation: Sinus tachycardia Right atrial enlargement Rightward axis Pulmonary disease pattern Nonspecific ST abnormality Abnormal ECG Since last tracing Rate faster Right atrial enlargement NOW PRESENT Confirmed by Calvert Cantor [phone]) on  02/19/2021 4:37:12 PM  Radiology DG Chest 2 View  Result Date: 02/19/2021 CLINICAL DATA:  Shortness of breath, fever, cough. EXAM: CHEST - 2 VIEW COMPARISON:  September 02, 2020. FINDINGS: The heart size and mediastinal contours are within normal limits. Both lungs are clear. The visualized skeletal structures are unremarkable. IMPRESSION: No active cardiopulmonary disease. Electronically Signed   By: Marijo Conception M.D.   On: 02/19/2021 15:25   CT Angio Chest PE W/Cm &/Or Wo Cm  Result Date: 02/19/2021 CLINICAL DATA:  Shortness of breath EXAM: CT ANGIOGRAPHY CHEST WITH CONTRAST TECHNIQUE: Multidetector CT imaging of the chest was performed using the standard protocol during bolus administration of intravenous contrast. Multiplanar CT image reconstructions and MIPs were obtained to evaluate the vascular anatomy. CONTRAST:  74mL OMNIPAQUE IOHEXOL 350 MG/ML SOLN COMPARISON:  Chest x-ray 02/19/2021, CT chest 11/27/2015 FINDINGS: Cardiovascular: Satisfactory opacification of the pulmonary arteries to the segmental level. No evidence of pulmonary embolism. Normal heart size. No pericardial effusion. Nonaneurysmal aorta. No dissection is seen. Mediastinum/Nodes: No enlarged mediastinal, hilar, or axillary lymph nodes. Thyroid gland, trachea, and esophagus demonstrate no significant findings. Lungs/Pleura: No consolidation, pleural effusion, or pneumothorax. Minimal focus of ground-glass nodularity in the lingula, series 6, image 84, likely infectious or inflammatory. Upper Abdomen: No  acute abnormality. Musculoskeletal: No chest wall abnormality. No acute or significant osseous findings. Review of the MIP images confirms the above findings. IMPRESSION: 1. Negative for acute pulmonary embolus or aortic dissection. 2. Small focus of ground-glass nodularity at the lingula, likely infectious or inflammatory. No consolidative pneumonia is seen. Electronically Signed   By: Donavan Foil M.D.   On: 02/19/2021 18:55    Procedures Procedures  Medications Ordered in the ED Medications  albuterol (VENTOLIN HFA) 108 (90 Base) MCG/ACT inhaler 2 puff (has no administration in time range)  iohexol (OMNIPAQUE) 350 MG/ML injection 75 mL (75 mLs Intravenous Contrast Given 02/19/21 1830)     MDM Rules/Calculators/A&P MDM Patient with pleuritic chest pain, racing heart and clear CXR. Will send for CTA to eval PE or occult PNA. She was tachycardic on EKG but improved on exam.   ED Course  I have reviewed the triage vital signs and the nursing notes.  Pertinent labs & imaging results that were available during my care of the patient were reviewed by me and considered in my medical decision making (see chart for details).  Clinical Course as of 02/19/21 1945  Fri Feb 19, 2021  1745 CBC with mild leukocytosis.  [CS]  1829 BMP and Trop are neg.  [CS]  1917 CTA neg for PE, possibly a lingular pneumonia. Repeat Trop pending, if remains neg, plan discharge with Rx for doxycycline.  [CS]  1943 Repeat Trop is normal. Patient is eager to leave, she is also requesting inhaler and tessalon perles to help with her cough. PCP follow up.  [CS]    Clinical Course User Index [CS] Truddie Hidden, MD    Final Clinical Impression(s) / ED Diagnoses Final diagnoses:  Community acquired pneumonia of left lung, unspecified part of lung    Rx / DC Orders ED Discharge Orders          Ordered    albuterol (VENTOLIN HFA) 108 (90 Base) MCG/ACT inhaler  Every 6 hours PRN        02/19/21 1945     doxycycline (VIBRAMYCIN) 100 MG capsule  2 times daily        02/19/21 1945    benzonatate (TESSALON) 100 MG  capsule  Every 8 hours        02/19/21 1945             Pollyann Savoy, MD 02/19/21 1945

## 2021-06-21 ENCOUNTER — Other Ambulatory Visit: Payer: Self-pay

## 2021-06-21 ENCOUNTER — Encounter (HOSPITAL_COMMUNITY): Payer: Self-pay

## 2021-06-21 ENCOUNTER — Emergency Department (HOSPITAL_COMMUNITY)
Admission: EM | Admit: 2021-06-21 | Discharge: 2021-06-22 | Disposition: A | Discharge status: Home / Self Care | Payer: 59 | Attending: Emergency Medicine | Admitting: Emergency Medicine

## 2021-06-21 DIAGNOSIS — D751 Secondary polycythemia: Secondary | ICD-10-CM | POA: Diagnosis not present

## 2021-06-21 DIAGNOSIS — R7401 Elevation of levels of liver transaminase levels: Secondary | ICD-10-CM | POA: Diagnosis not present

## 2021-06-21 DIAGNOSIS — R739 Hyperglycemia, unspecified: Secondary | ICD-10-CM | POA: Insufficient documentation

## 2021-06-21 DIAGNOSIS — F411 Generalized anxiety disorder: Secondary | ICD-10-CM | POA: Insufficient documentation

## 2021-06-21 DIAGNOSIS — R45851 Suicidal ideations: Secondary | ICD-10-CM | POA: Insufficient documentation

## 2021-06-21 DIAGNOSIS — F3110 Bipolar disorder, current episode manic without psychotic features, unspecified: Secondary | ICD-10-CM | POA: Insufficient documentation

## 2021-06-21 DIAGNOSIS — R779 Abnormality of plasma protein, unspecified: Secondary | ICD-10-CM | POA: Insufficient documentation

## 2021-06-21 DIAGNOSIS — F4310 Post-traumatic stress disorder, unspecified: Secondary | ICD-10-CM | POA: Insufficient documentation

## 2021-06-21 DIAGNOSIS — F309 Manic episode, unspecified: Secondary | ICD-10-CM

## 2021-06-21 DIAGNOSIS — Z046 Encounter for general psychiatric examination, requested by authority: Secondary | ICD-10-CM | POA: Diagnosis present

## 2021-06-21 LAB — SALICYLATE LEVEL: Salicylate Lvl: <7 mg/dL — ABNORMAL LOW (ref 7.0–30.0)

## 2021-06-21 LAB — COMPREHENSIVE METABOLIC PANEL
ALT: 46 U/L — ABNORMAL HIGH (ref 0–44)
AST: 36 U/L (ref 15–41)
Albumin: 4.5 g/dL (ref 3.5–5.0)
Alkaline Phosphatase: 372 U/L — ABNORMAL HIGH (ref 38–126)
Anion gap: 9 (ref 5–15)
BUN: 8 mg/dL (ref 6–20)
CO2: 21 mmol/L — ABNORMAL LOW (ref 22–32)
Calcium: 9.5 mg/dL (ref 8.9–10.3)
Chloride: 109 mmol/L (ref 98–111)
Creatinine, Ser: 0.69 mg/dL (ref 0.44–1.00)
GFR, Estimated: >60 mL/min (ref >60)
Glucose, Bld: 107 mg/dL — ABNORMAL HIGH (ref 70–99)
Potassium: 3.7 mmol/L (ref 3.5–5.1)
Sodium: 139 mmol/L (ref 135–145)
Total Bilirubin: 0.4 mg/dL (ref 0.3–1.2)
Total Protein: 8.3 g/dL — ABNORMAL HIGH (ref 6.5–8.1)

## 2021-06-21 LAB — CBC
HCT: 45.7 % (ref 36.0–46.0)
Hemoglobin: 15.7 g/dL — ABNORMAL HIGH (ref 12.0–15.0)
MCH: 32.7 pg (ref 26.0–34.0)
MCHC: 34.4 g/dL (ref 30.0–36.0)
MCV: 95.2 fL (ref 80.0–100.0)
Platelets: 329 10*3/uL (ref 150–400)
RBC: 4.8 MIL/uL (ref 3.87–5.11)
RDW: 12.8 % (ref 11.5–15.5)
WBC: 9 10*3/uL (ref 4.0–10.5)
nRBC: 0 % (ref 0.0–0.2)

## 2021-06-21 LAB — RAPID URINE DRUG SCREEN, HOSP PERFORMED
Amphetamines: NOT DETECTED
Barbiturates: NOT DETECTED
Benzodiazepines: POSITIVE — AB
Cocaine: NOT DETECTED
Opiates: NOT DETECTED
Tetrahydrocannabinol: POSITIVE — AB

## 2021-06-21 LAB — POC URINE PREG, ED: Preg Test, Ur: NEGATIVE

## 2021-06-21 LAB — ACETAMINOPHEN LEVEL: Acetaminophen (Tylenol), Serum: <10 ug/mL — ABNORMAL LOW (ref 10–30)

## 2021-06-21 LAB — ETHANOL: Alcohol, Ethyl (B): <10 mg/dL (ref <10)

## 2021-06-21 MED ORDER — HYDROXYZINE HCL 25 MG PO TABS
50.0000 mg | ORAL_TABLET | Freq: Once | ORAL | Status: AC
  Administered 2021-06-21: 50 mg via ORAL
  Filled 2021-06-21: qty 2

## 2021-06-21 MED ORDER — HYDROXYZINE PAMOATE 50 MG PO CAPS
100.0000 mg | ORAL_CAPSULE | Freq: Three times a day (TID) | ORAL | Status: DC | PRN
  Filled 2021-06-21: qty 2

## 2021-06-21 MED ORDER — VORTIOXETINE HBR 20 MG PO TABS
20.0000 mg | ORAL_TABLET | Freq: Every day | ORAL | Status: DC
Start: 2021-06-21 — End: 2021-06-22
  Administered 2021-06-21 – 2021-06-22 (×2): 20 mg via ORAL
  Filled 2021-06-21 (×5): qty 1

## 2021-06-21 MED ORDER — STERILE WATER FOR INJECTION IJ SOLN
INTRAMUSCULAR | Status: AC
  Administered 2021-06-21: 10 mL
  Filled 2021-06-21: qty 10

## 2021-06-21 MED ORDER — ZIPRASIDONE MESYLATE 20 MG IM SOLR
10.0000 mg | Freq: Once | INTRAMUSCULAR | Status: AC | PRN
  Administered 2021-06-21: 10 mg via INTRAMUSCULAR
  Filled 2021-06-21: qty 20

## 2021-06-21 NOTE — ED Notes (Signed)
Pt states that she doesnt want any family to have access at this point with her mental health records.  Earlier she told pa that she could talk to family about her visit and history.  Pt states that she is angry about IVC order placed.  States that she will "raise hell."  Pt offered sandwich and cracker and she refused after saying that she wanted food and was hungry.   ?

## 2021-06-21 NOTE — ED Notes (Signed)
Patient states that she has poison oak on her left foot and her belly. Notified Rn. ?

## 2021-06-21 NOTE — ED Triage Notes (Signed)
Patient states she presented to ED for suicidal ideation without plan. Patient states she is depressed from losing her husband 10 months ago.  ?

## 2021-06-21 NOTE — ED Notes (Signed)
Patient tried to leave ER, pt was stopped in the hallway by my self, nurses and security. Pt escorted back to the hallway to her bed. ?

## 2021-06-21 NOTE — ED Notes (Signed)
Patient reports that she has a rash on her abdomen and foot stated that she came in contact with poison ivy. EDP notified. ?

## 2021-06-21 NOTE — ED Notes (Signed)
Patient on phone with Casimiro Needle, her dad and a roommate. She is wanting to talk to a nurse because no one has told her anything.I got the charge nurse. Patient is standing in hallway and asked her to sit in bed and she said she will when the dr comes and sees her. Patient is wanting transferred to another facility. She is stating  that she is going to walk out and leave. Nurse aware.  ?

## 2021-06-21 NOTE — ED Provider Notes (Signed)
?[facility] ?Provider Note ? ? ?CSN: [id number] ?Arrival date & time: 06/21/21  1353 ? ?  ? ?History ?Chief Complaint  ?Patient presents with  ? Psychiatric Evaluation  ? ? ?Emma Conner is a 41 y.o. female with self-reported history of GAD, bipolar, PTSD, OCD, insomnia presents emerged department for suicidal ideations.  She reports that she "does not for a cry for attention" but recently attempted suicide in February.  She reports has been under a lot of stress with the recent deaths of Emma husband and Emma son running away in [place] and Emma ex-husband not taking care of it.  She usually takes Trintellix and Vistaril for Emma anxiety, but [facility] was out of Trintellix and she has been off of it for the past few weeks.  She reports she took Ativan last night and 1 in the morning and reports she did have some help with that.  She denies any recent suicidal attempts, although when speaking to patient's fianc?, he reports that he is seen here take over 50 sleeping pills and or Tylenol 3 months ago.  He also reports that he seen Emma cut herself with a razor blade within the past week and reports that she is trying with the pain go away.  She denies any homicidal ideations. ? ?HPI ? ?  ? ?Home Medications ?Prior to Admission medications   ?Medication Sig Start Date End Date Taking? Authorizing Provider  ?albuterol (VENTOLIN HFA) 108 (90 Base) MCG/ACT inhaler Inhale 1-2 puffs into the lungs every 6 (six) hours as needed for wheezing or shortness of breath. 02/19/21   Truddie Hidden, MD  ?baclofen (LIORESAL) 10 MG tablet Take 10 mg by mouth 3 (three) times daily.    [provider]  ?benzonatate (TESSALON) 100 MG capsule Take 1 capsule (100 mg total) by mouth every 8 (eight) hours. 02/19/21   Truddie Hidden, MD  ?carbamazepine (TEGRETOL XR) 200 MG 12 hr tablet Take 1 tablet (200 mg total) by mouth 2 (two) times daily. ?Patient not taking: Reported on 04/22/2020 07/01/16   Patrecia Pour, NP  ?diclofenac Sodium (VOLTAREN) 1 % GEL Apply 2 g topically 4 (four) times daily. 09/02/20   Couture, Cortni S, PA-C  ?doxycycline (VIBRAMYCIN) 100 MG capsule Take 1 capsule (100 mg total) by mouth 2 (two) times daily. 02/19/21   Truddie Hidden, MD  ?FLUoxetine (PROZAC) 40 MG capsule Take 1 capsule (40 mg total) by mouth daily. ?Patient not taking: Reported on 04/22/2020 07/02/16   Patrecia Pour, NP  ?gabapentin (NEURONTIN) 300 MG capsule Take 300 mg by mouth 2 (two) times daily.    [provider]  ?hydrOXYzine (VISTARIL) 100 MG capsule Take 100 mg by mouth 3 (three) times daily as needed for itching.    [provider]  ?ibuprofen (ADVIL) 800 MG tablet TAKE 1 TABLET(800 MG) BY MOUTH EVERY 8 HOURS AS NEEDED FOR PAIN ?Patient not taking: Reported on 04/22/2020 02/19/19   [provider]  ?lamoTRIgine (LAMICTAL) 25 MG tablet Take 1 tablet (25 mg total) by mouth daily. Take one tablet daily for a week and then start taking 2 tablets. ?Patient not taking: Reported on 04/22/2020 05/10/19   Merian Capron, MD  ?lisdexamfetamine (VYVANSE) 40 MG capsule Take 40 mg by mouth every morning.    [provider]  ?loratadine (CLARITIN) 10 MG tablet Take 10 mg by mouth daily.    [provider]  ?montelukast (SINGULAIR) 10 MG tablet TAKE 1 TABLET(10  MG) BY MOUTH DAILY 01/30/19   [provider]  ?ondansetron (ZOFRAN) 8 MG tablet Take by mouth every 8 (eight) hours as needed for nausea or vomiting.    [provider]  ?SUMAtriptan (IMITREX) 50 MG tablet Take 50 mg by mouth every 2 (two) hours as needed for migraine. May repeat in 2 hours if headache persists or recurs.    [provider]  ?traZODone (DESYREL) 50 MG tablet Take 1 tablet (50 mg total) by mouth at bedtime and may repeat dose one time if needed. ?Patient not taking: Reported on 04/22/2020 07/01/16   Patrecia Pour, NP  ?vortioxetine HBr (TRINTELLIX) 20 MG TABS tablet TAKE 1 TABLET(20  MG) BY MOUTH DAILY 05/08/19   [provider]  ?zolpidem (AMBIEN) 5 MG tablet Take 5 mg by mouth once.    [provider]  ?   ? ?Allergies    ?Doxycycline hyclate, Benadryl [diphenhydramine], Penicillins, Tegretol [carbamazepine], and Ultram [tramadol]   ? ?Review of Systems   ?Review of Systems  ?Psychiatric/Behavioral:  Positive for suicidal ideas. The patient is nervous/anxious.   ? ?Physical Exam ?Updated Vital Signs ?BP 105/76 (BP Location: Left Arm)   Pulse 84   Temp 97.6 ?F (36.4 ?C) (Oral)   Resp 18   Ht 5\' 7"  (1.702 m)   Wt 77.1 kg   SpO2 99%   BMI 26.63 kg/m?  ?Physical Exam ?Vitals and nursing note reviewed.  ?Constitutional:   ?   Appearance: She is not toxic-appearing.  ?   Comments: Agitated, tearful  ?HENT:  ?   Mouth/Throat:  ?   Mouth: Mucous membranes are moist.  ?   Comments: Poor dentition ?Eyes:  ?   General: No scleral icterus. ?Pulmonary:  ?   Effort: Pulmonary effort is normal. No respiratory distress.  ?Skin: ?   General: Skin is dry.  ?   Findings: No rash.  ?Neurological:  ?   General: No focal deficit present.  ?   Mental Status: She is alert. Mental status is at baseline.  ?Psychiatric:     ?   Attention and Perception: Attention normal. She does not perceive auditory or visual hallucinations.     ?   Mood and Affect: Affect is labile.     ?   Speech: Speech is rapid and pressured and tangential.     ?   Behavior: Behavior is agitated and aggressive.     ?   Thought Content: Thought content includes suicidal ideation. Thought content does not include homicidal ideation. Thought content does not include homicidal or suicidal plan.  ?   Comments: The patient does not appear to be responding to any internal stimuli. She has a labile affect with tangential, rapid, and pressures speech. She is agitated and aggressive.   ? ? ?ED Results / Procedures / Treatments   ?Labs ?(all labs ordered are listed, but only abnormal results are displayed) ?Labs Reviewed   ?COMPREHENSIVE METABOLIC PANEL - Abnormal; Notable for the following components:  ?    Result Value  ? CO2 21 (*)   ? Glucose, Bld 107 (*)   ? Total Protein 8.3 (*)   ? ALT 46 (*)   ? Alkaline Phosphatase 372 (*)   ? All other components within normal limits  ?SALICYLATE LEVEL - Abnormal; Notable for the following components:  ? Salicylate Lvl [id number] (*)   ? All other components within normal limits  ?ACETAMINOPHEN LEVEL - Abnormal; Notable for the following components:  ?  Acetaminophen (Tylenol), Serum <10 (*)   ? All other components within normal limits  ?CBC - Abnormal; Notable for the following components:  ? Hemoglobin 15.7 (*)   ? All other components within normal limits  ?RAPID URINE DRUG SCREEN, HOSP PERFORMED - Abnormal; Notable for the following components:  ? Benzodiazepines POSITIVE (*)   ? Tetrahydrocannabinol POSITIVE (*)   ? All other components within normal limits  ?ETHANOL  ?POC URINE PREG, ED  ? ? ?EKG ?None ? ?Radiology ?No results found. ? ?Procedures ?Procedures  ? ?Medications Ordered in ED ?Medications - No data to display ? ?ED Course/ Medical Decision Making/ A&P ?  ?                        ?Medical Decision Making ?Amount and/or Complexity of Data Reviewed ?Labs: ordered. ? ?Risk ?Prescription drug management. ? ? ?41 year old female presents emerged department for suicidal ideations.  The patient reports she has not been on Emma medication for several weeks as [facility] did not have appropriate stock.  She has been under a lot of life stressors recently with Emma son running away from Emma ex-husband's house, death of Emma recent husband less than a year ago, and problems with Emma fianc? currently.  The patient was on the phone with Emma fianc? and roommate and gave me permission to talk to them about what was going on here.  Fianc? reports that he is concerned for the patient as he is seen here take "50 sleeping pills or Tylenol 3 months ago" and he reports that she will do it again.  He  reports that she recently used a razor to try and cut herself within the past week.  The patient has a history of bipolar and generalized anxiety disorder.  She reports other mental health disorders as well.  Given the patient's ag

## 2021-06-21 NOTE — ED Notes (Signed)
Patient gives permission to talk to Elias Else Roommate:[phone] ? ?Erskine Emery Fiance: [phone] ? ?Richard Sailer: her father [phone] ?

## 2021-06-21 NOTE — ED Notes (Signed)
Pts items were put in a belongings bag and placed in locker room  ?Pt had no cell phone  ?Pt just had clothes  ?
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
## 2021-06-21 NOTE — ED Notes (Signed)
Called security to lock up  patient 's personal belongings. Gave security two silver color hoop earrings and  one tongue ring. ?

## 2021-06-22 DIAGNOSIS — F309 Manic episode, unspecified: Secondary | ICD-10-CM | POA: Diagnosis not present

## 2021-06-22 DIAGNOSIS — R45851 Suicidal ideations: Secondary | ICD-10-CM | POA: Diagnosis not present

## 2021-06-22 MED ORDER — HYDROXYZINE HCL 25 MG PO TABS: 100.0000 mg | ORAL_TABLET | Freq: Three times a day (TID) | ORAL | Status: DC | PRN

## 2021-06-22 MED ORDER — ACETAMINOPHEN 325 MG PO TABS
650.0000 mg | ORAL_TABLET | Freq: Once | ORAL | Status: AC
  Administered 2021-06-22: 650 mg via ORAL
  Filled 2021-06-22: qty 2

## 2021-06-22 NOTE — Discharge Instructions (Signed)
Follow up as instructed by behavior health 
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
## 2021-06-22 NOTE — ED Notes (Signed)
Pt to the family room for reassessment. ? ?

## 2021-06-22 NOTE — ED Notes (Signed)
This nurse tried to contact BH to request a note for the chart stating the pt has been psych cleared. Pt is unable to be discharged until a note stating the pt has been psych cleared is posted in the chart. ?

## 2022-02-19 IMAGING — DX DG CHEST 2V
2 series · 2 of 2 positions shown · non-contrast
Comparison: September 02, 2020.

CLINICAL DATA: Shortness of breath, fever, cough.

EXAM:
CHEST - 2 VIEW

[chest pa]
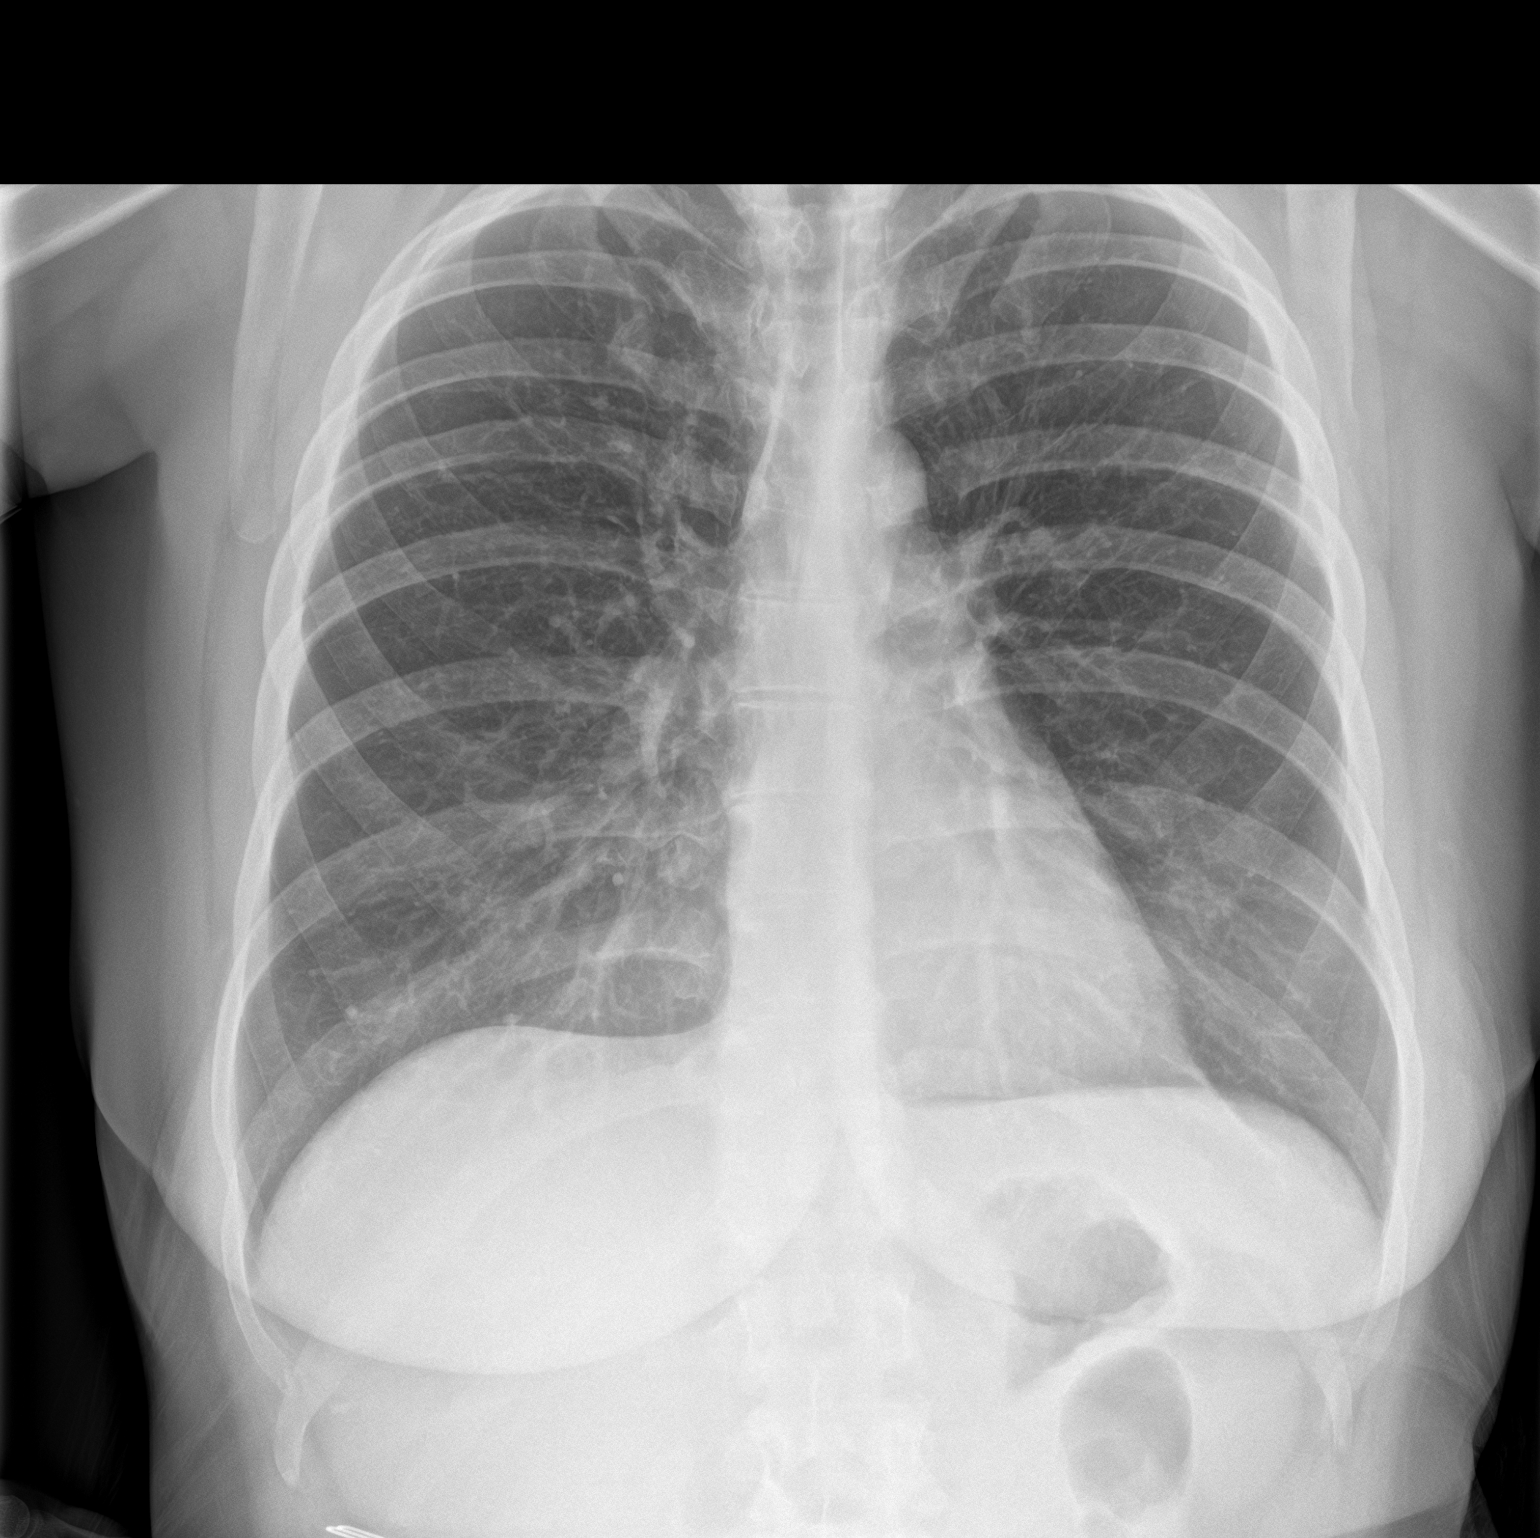

[chest lat]
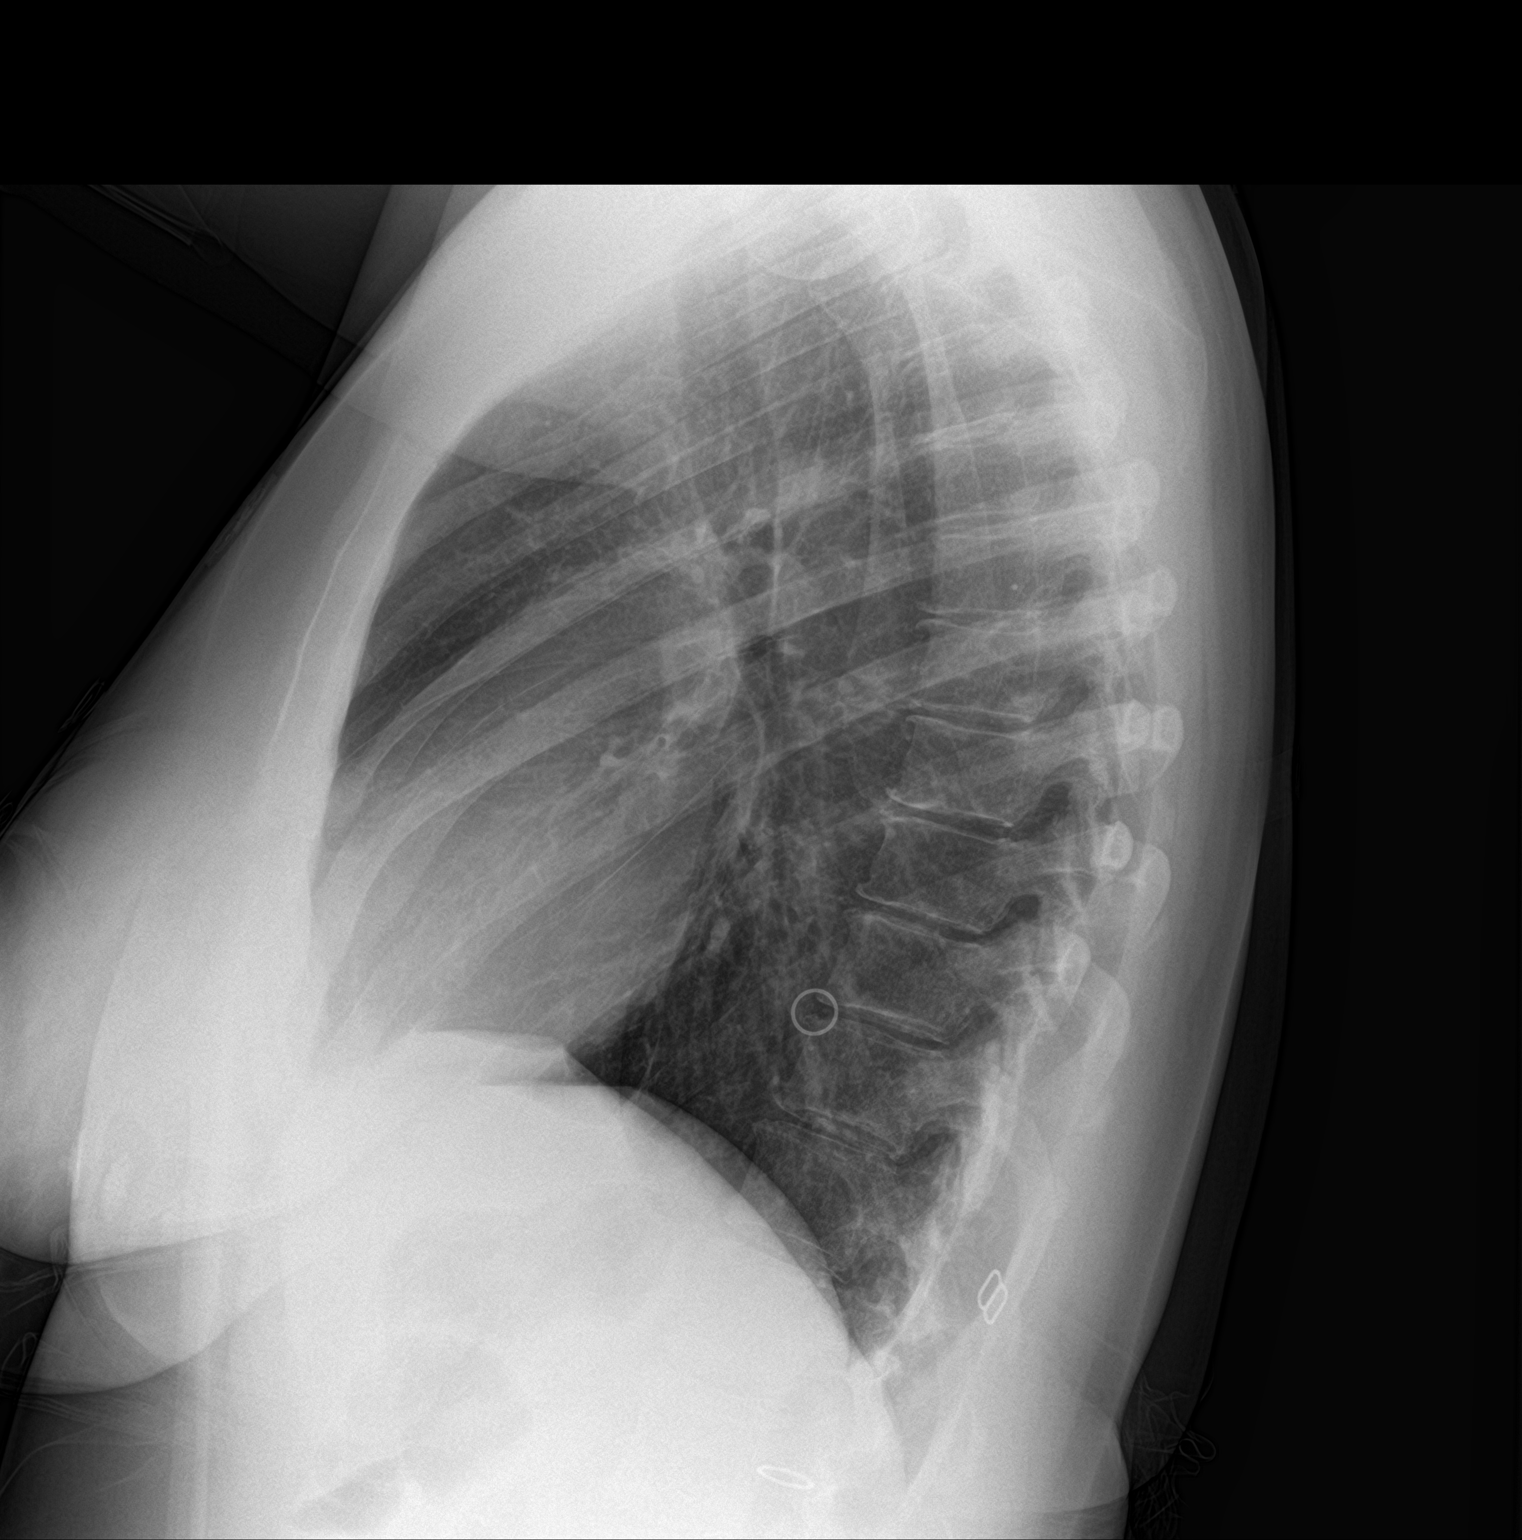

[2 of 2 positions shown; findings below may reference images not displayed]

FINDINGS: The heart size and mediastinal contours are within normal limits.
Both lungs are clear. The visualized skeletal structures are
unremarkable.
IMPRESSION: No active cardiopulmonary disease.

## 2022-02-19 IMAGING — CT CT ANGIO CHEST
2 of 6 series · 19 of 36 positions shown · IV contrast (Omnipaque or Isovue)
Comparison: Chest x-ray 02/19/2021, CT chest 11/27/2015

CLINICAL DATA: Shortness of breath

EXAM:
CT ANGIOGRAPHY CHEST WITH CONTRAST
TECHNIQUE: Multidetector CT imaging of the chest was performed using the
standard protocol during bolus administration of intravenous
contrast. Multiplanar CT image reconstructions and MIPs were
obtained to evaluate the vascular anatomy.
CONTRAST:  75mL OMNIPAQUE IOHEXOL 350 MG/ML SOLN

[Series 5: pe axial thins · axial · 0.73mm/px · z∈[+1423,+1716]mm · 18 of 407 slices shown]
[im 20/407  lung]
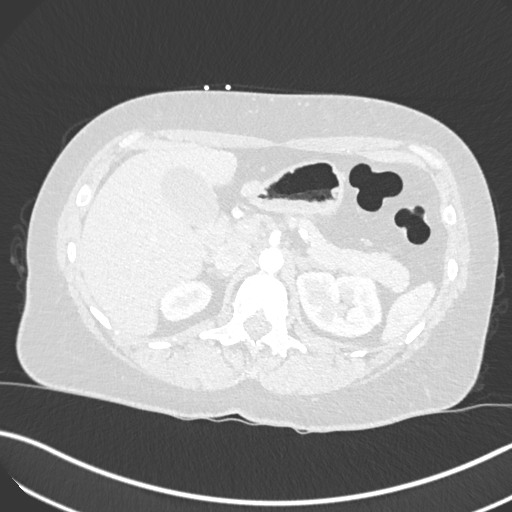
[im 39/407  mediastinal]
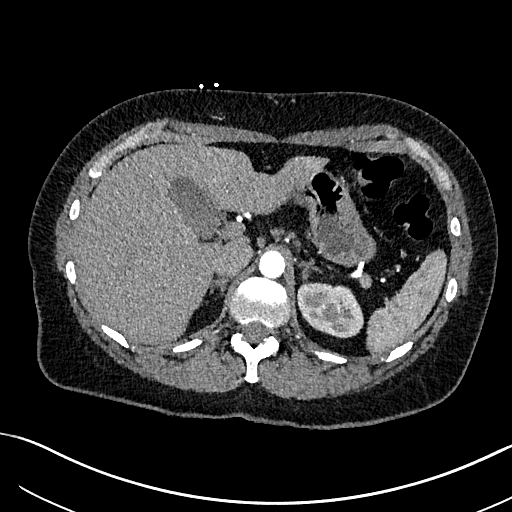
[im 59/407  lung]
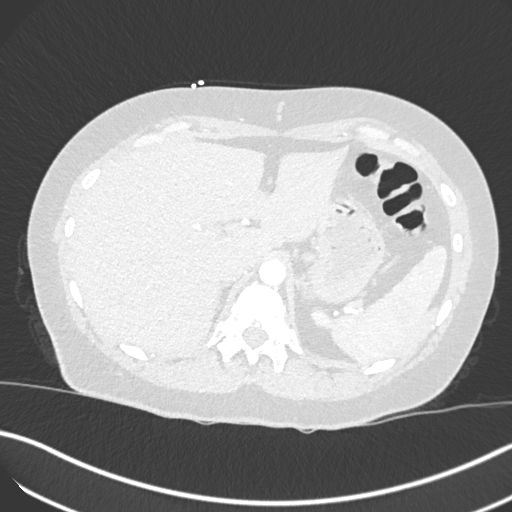
[im 78/407  mediastinal]
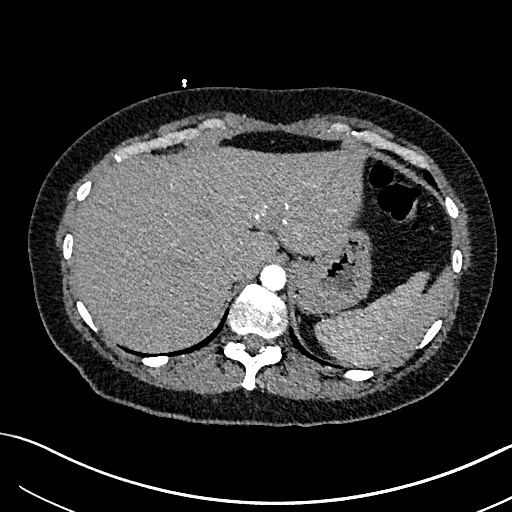
[im 117/407  lung]
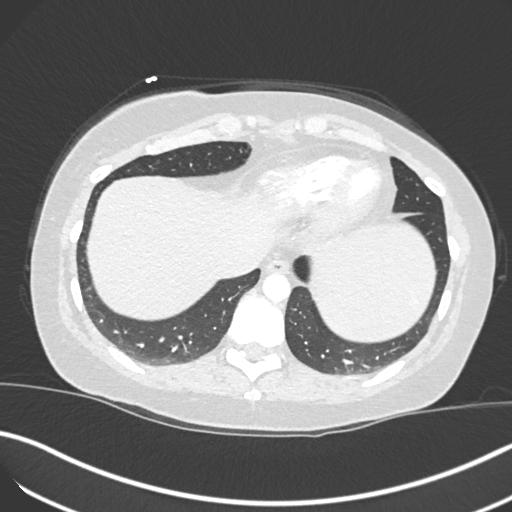
[im 136/407  mediastinal]
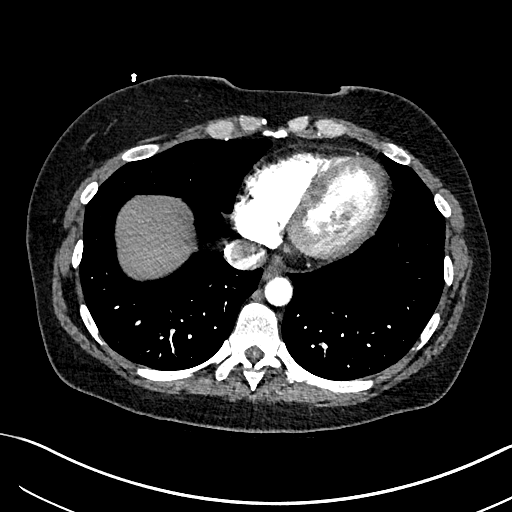
[im 155/407  lung]
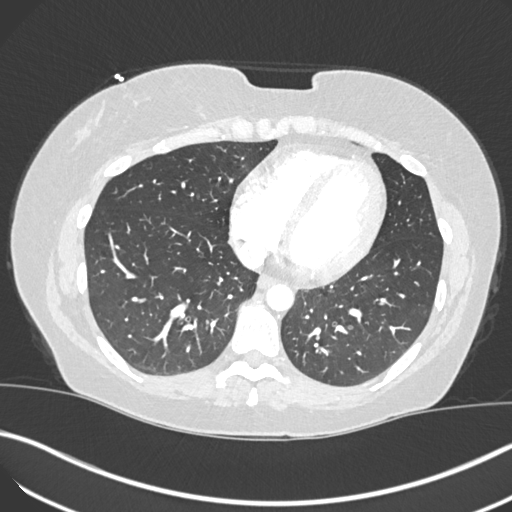
[im 175/407  mediastinal]
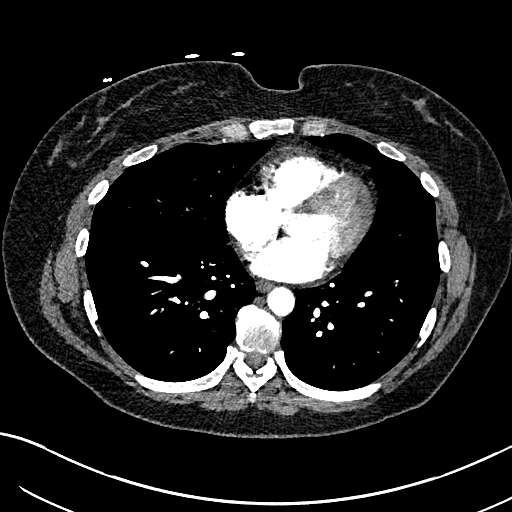
[im 194/407  lung]
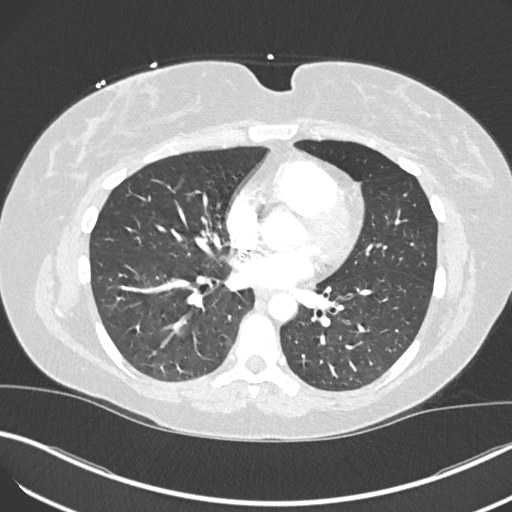
[im 213/407  mediastinal]
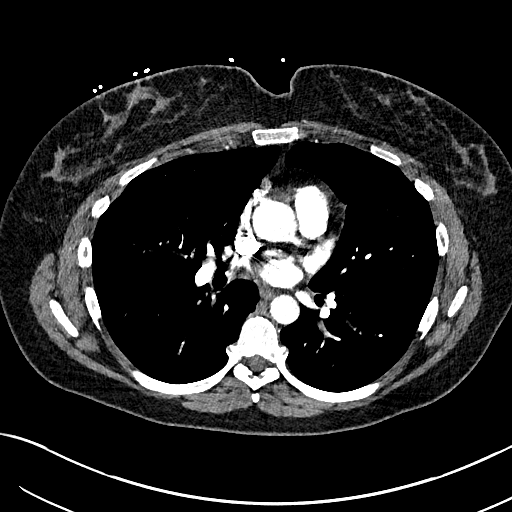
[im 233/407  lung]
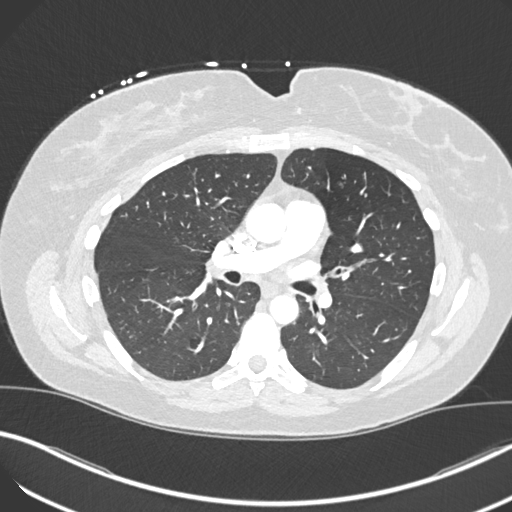
[im 252/407  mediastinal]
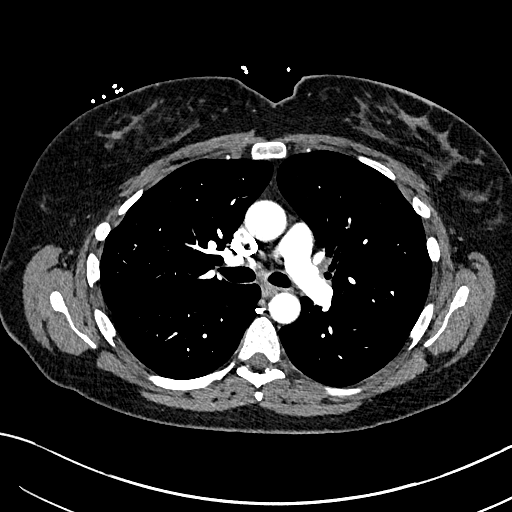
[im 271/407  lung]
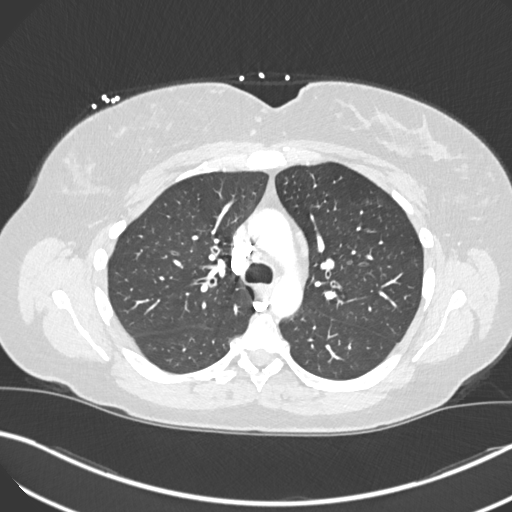
[im 310/407  mediastinal]
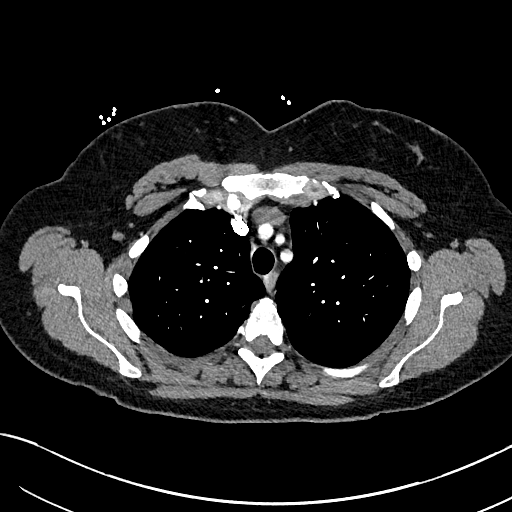
[im 329/407  lung]
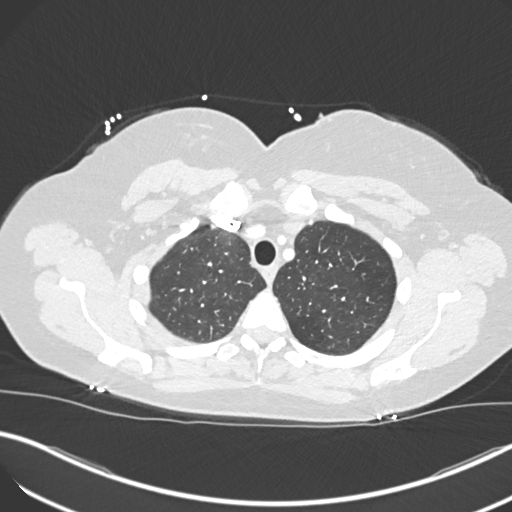
[im 349/407  mediastinal]
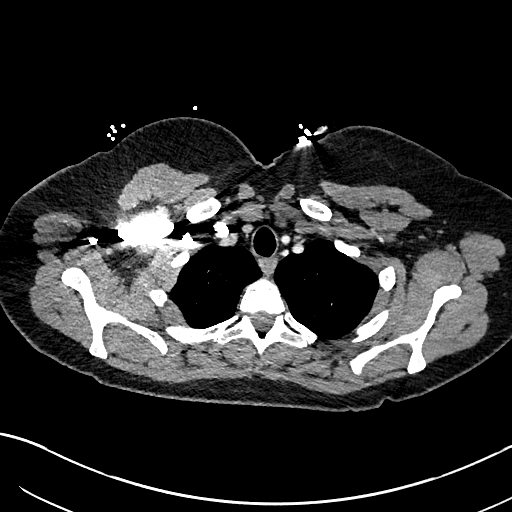
[im 368/407  lung]
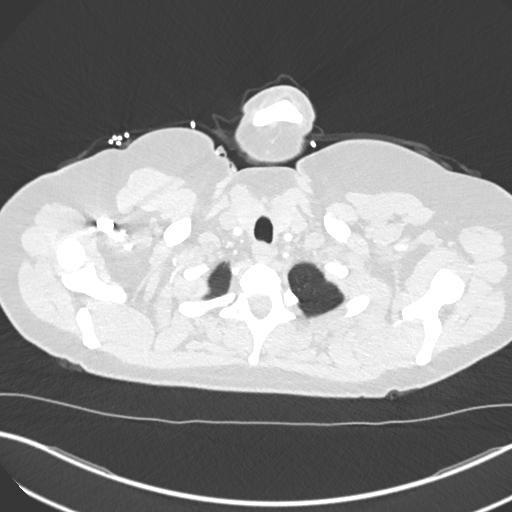
[im 387/407  mediastinal]
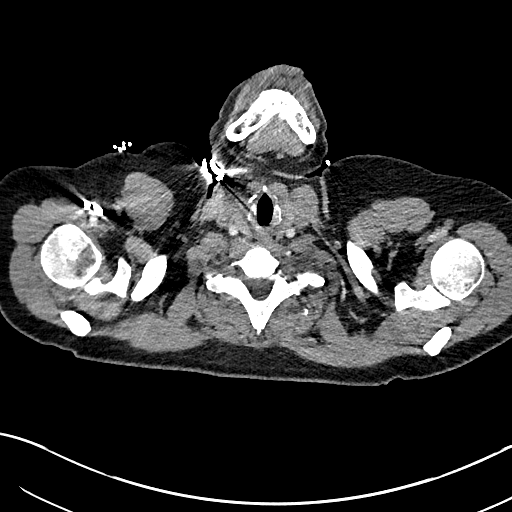

[Series 7: cor soft · coronal · 0.65mm/px · 1 of 143 slices shown]
[im 72/143  mediastinal]
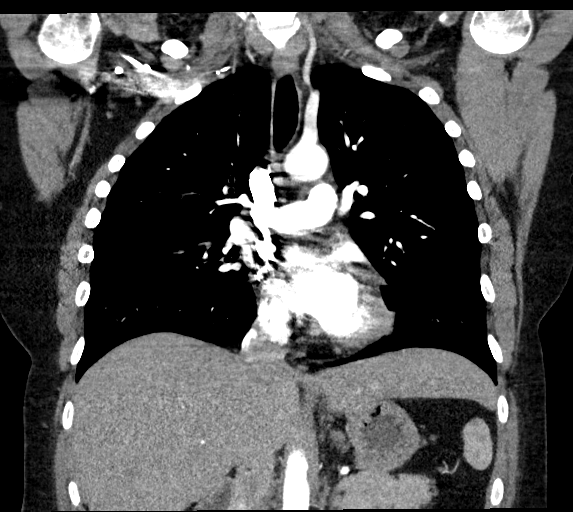

[19 of 36 positions shown; findings below may reference images not displayed]

FINDINGS: Cardiovascular: Satisfactory opacification of the pulmonary arteries
to the segmental level. No evidence of pulmonary embolism. Normal
heart size. No pericardial effusion. Nonaneurysmal aorta. No
dissection is seen.

Mediastinum/Nodes: No enlarged mediastinal, hilar, or axillary lymph
nodes. Thyroid gland, trachea, and esophagus demonstrate no
significant findings.

Lungs/Pleura: No consolidation, pleural effusion, or pneumothorax.
Minimal focus of ground-glass nodularity in the lingula, series 6,
image 84, likely infectious or inflammatory.

Upper Abdomen: No acute abnormality.

Musculoskeletal: No chest wall abnormality. No acute or significant
osseous findings.

Review of the MIP images confirms the above findings.
IMPRESSION: 1. Negative for acute pulmonary embolus or aortic dissection.
2. Small focus of ground-glass nodularity at the lingula, likely
infectious or inflammatory. No consolidative pneumonia is seen.

## 2022-05-05 ENCOUNTER — Encounter: Payer: Self-pay | Admitting: Radiology

## 2023-12-06 ENCOUNTER — Other Ambulatory Visit: Payer: Self-pay | Admitting: Medical Genetics
